# Patient Record
Sex: Female | Born: 1950 | Race: Black or African American | Hispanic: No | Marital: Single | Smoking: Never smoker
Health system: Southern US, Community
[De-identification: ages and names within clinical notes are randomized; demographics above are authoritative.]

## PROBLEM LIST (undated history)

## (undated) DIAGNOSIS — C49A2 Gastrointestinal stromal tumor of stomach: Secondary | ICD-10-CM

## (undated) DIAGNOSIS — E785 Hyperlipidemia, unspecified: Secondary | ICD-10-CM

## (undated) DIAGNOSIS — I10 Essential (primary) hypertension: Secondary | ICD-10-CM

## (undated) HISTORY — DX: Hyperlipidemia, unspecified: E78.5

## (undated) HISTORY — DX: Gastrointestinal stromal tumor of stomach: C49.A2

---

## 2012-09-28 ENCOUNTER — Emergency Department (INDEPENDENT_AMBULATORY_CARE_PROVIDER_SITE_OTHER)
Admission: EM | Admit: 2012-09-28 | Discharge: 2012-09-28 | Disposition: A | Discharge status: Home / Self Care | Payer: Self-pay | Source: Home / Self Care

## 2012-09-28 ENCOUNTER — Encounter (HOSPITAL_COMMUNITY): Payer: Self-pay | Admitting: Emergency Medicine

## 2012-09-28 DIAGNOSIS — I10 Essential (primary) hypertension: Secondary | ICD-10-CM

## 2012-09-28 HISTORY — DX: Essential (primary) hypertension: I10

## 2012-09-28 LAB — POCT I-STAT, CHEM 8
Chloride: 100 mEq/L (ref 96–112)
HCT: 43 % (ref 36.0–46.0)
Hemoglobin: 14.6 g/dL (ref 12.0–15.0)
Potassium: 2.8 mEq/L — ABNORMAL LOW (ref 3.5–5.1)

## 2012-09-28 MED ORDER — LISINOPRIL 20 MG PO TABS: 20.0000 mg | ORAL_TABLET | Freq: Every day | ORAL | Status: DC

## 2012-09-28 NOTE — ED Notes (Signed)
Patient has history of htn and has been on medicine in [place], arrived in [place] on Wednesday, reports htn medicine taken from her at airport.  Patient reports head with sharp apin, instructed daughter this is how head feels with her blood pressure going up.

## 2012-09-28 NOTE — ED Provider Notes (Signed)
Katie Howell is a 62 y.o. female who presents to Urgent Care today for hypertension. Patient recently arrived to the [place] from [place]. She has been here several days. She has run out of her blood pressure medication and cannot recall what it was. He otherwise feels well with no fevers or chills significant headache or chest pain. She does not yet have Dr. her and feels well otherwise.    PMH reviewed. HTN  History  Substance Use Topics  . Smoking status: Never Smoker   . Smokeless tobacco: Not on file  . Alcohol Use: No   ROS as above Medications reviewed. No current facility-administered medications for this encounter.   Current Outpatient Prescriptions  Medication Sig Dispense Refill  . lisinopril (PRINIVIL,ZESTRIL) 20 MG tablet Take 1 tablet (20 mg total) by mouth daily.  30 tablet  1    Exam:  BP 182/104  Pulse 68  Temp(Src) 97.8 F (36.6 C) (Oral)  Resp 16  SpO2 98%  LMP 09/28/2012 Gen: Well NAD HEENT: EOMI,  MMM Lungs: CTABL Nl WOB Heart: RRR no MRG Abd: NABS, NT, ND Exts: Non edematous BL  LE, warm and well perfused.   Results for orders placed during the hospital encounter of 09/28/12 (from the past 24 hour(s))  POCT I-STAT, CHEM 8     Status: Abnormal   Collection Time    09/28/12  4:22 PM      Result Value Range   Sodium 143  135 - 145 mEq/L   Potassium 2.8 (*) 3.5 - 5.1 mEq/L   Chloride 100  96 - 112 mEq/L   BUN 11  6 - 23 mg/dL   Creatinine, Ser 4.78  0.50 - 1.10 mg/dL   Glucose, Bld 87  70 - 99 mg/dL   Calcium, Ion 2.95  6.21 - 1.30 mmol/L   TCO2 31  0 - 100 mmol/L   Hemoglobin 14.6  12.0 - 15.0 g/dL   HCT 30.8  65.7 - 84.6 %   Comment NOTIFIED PHYSICIAN     No results found.  Assessment and Plan: 62 y.o. female with high blood pressure. Patient is not sure what medication she was on however based on her hypo-K it was likely hydrochlorothiazide. Plan to start lisinopril 20 mg and followup in 2 weeks for lab recheck and blood pressure  recheck. Discussed warning signs or symptoms. Please see discharge instructions. Patient expresses understanding.      Rodolph Bong, MD 09/28/12 [phone]

## 2012-10-28 ENCOUNTER — Emergency Department (INDEPENDENT_AMBULATORY_CARE_PROVIDER_SITE_OTHER)
Admission: EM | Admit: 2012-10-28 | Discharge: 2012-10-28 | Disposition: A | Discharge status: Home / Self Care | Payer: Self-pay | Source: Home / Self Care | Attending: Family Medicine | Admitting: Family Medicine

## 2012-10-28 ENCOUNTER — Encounter (HOSPITAL_COMMUNITY): Payer: Self-pay | Admitting: *Deleted

## 2012-10-28 DIAGNOSIS — I10 Essential (primary) hypertension: Secondary | ICD-10-CM

## 2012-10-28 NOTE — ED Notes (Signed)
Pt  Is  Out  Of  Her  Blood  Pressure  Medicine       -  She  Gets  Headaches      Her  bLooood pressure  Is  High  Today  She  Did  Not take       Her meds  Today    She  Has  No  pcp

## 2012-10-28 NOTE — ED Provider Notes (Signed)
CSN: [id number]     Arrival date & time 10/28/12  1137 History   First MD Initiated Contact with Patient 10/28/12 1310     Chief Complaint  Patient presents with  . Medication Refill   (Consider location/radiation/quality/duration/timing/severity/associated sxs/prior Treatment) HPI Comments: 62 year old female presents requesting a refill of her antihypertensive medication. She was prescribed this medication one month ago and has been taking it daily. She has a refill but she did not do this. She was having a headache prior to starting this medication but when she takes her lisinopril the headache is weight. She denies any other symptoms whatsoever.   Past Medical History  Diagnosis Date  . Hypertension    History reviewed. No pertinent past surgical history. History reviewed. No pertinent family history. History  Substance Use Topics  . Smoking status: Never Smoker   . Smokeless tobacco: Not on file  . Alcohol Use: No   OB History   Grav Para Term Preterm Abortions TAB SAB Ect Mult Living                 Review of Systems  Constitutional: Negative for fever and chills.  Eyes: Negative for visual disturbance.  Respiratory: Negative for cough and shortness of breath.   Cardiovascular: Negative for chest pain, palpitations and leg swelling.  Gastrointestinal: Negative for nausea, vomiting and abdominal pain.  Endocrine: Negative for polydipsia and polyuria.  Genitourinary: Negative for dysuria, urgency and frequency.  Musculoskeletal: Negative for myalgias and arthralgias.  Skin: Negative for rash.  Neurological: Positive for headaches (resolved with the medication, although this has returned slightly because she did not take the lisinopril today). Negative for dizziness, weakness and light-headedness.    Allergies  Review of patient's allergies indicates no known allergies.  Home Medications   Current Outpatient Rx  Name  Route  Sig  Dispense  Refill  . lisinopril  (PRINIVIL,ZESTRIL) 20 MG tablet   Oral   Take 1 tablet (20 mg total) by mouth daily.   30 tablet   1    BP 180/117  Pulse 84  Temp(Src) 98.4 F (36.9 C) (Oral)  Resp 16  SpO2 100%  LMP 09/28/2012 Physical Exam  Nursing note and vitals reviewed. Constitutional: She is oriented to person, place, and time. She appears well-developed and well-nourished. No distress.  HENT:  Head: Normocephalic and atraumatic.  Pulmonary/Chest: Effort normal. No respiratory distress.  Neurological: She is alert and oriented to person, place, and time. Coordination normal.  Skin: Skin is warm and dry. No rash noted. She is not diaphoretic.  Psychiatric: She has a normal mood and affect. Judgment normal.    ED Course  Procedures (including critical care time) Labs Review Labs Reviewed - No data to display Imaging Review No results found.  MDM   1. Hypertension    She will go get her refill on her medication. She will followup at the [facility].    Graylon Good, PA-C 10/28/12 1349

## 2012-10-28 NOTE — ED Provider Notes (Signed)
Medical screening examination/treatment/procedure(s) were performed by non-physician practitioner and as supervising physician I was immediately available for consultation/collaboration.   MORENO-COLL,Pernell Dikes; MD  Amadi Frady Moreno-Coll, MD 10/28/12 1414 

## 2012-12-02 ENCOUNTER — Ambulatory Visit: Payer: Self-pay | Attending: Cardiology | Admitting: Cardiology

## 2012-12-02 ENCOUNTER — Encounter: Payer: Self-pay | Admitting: Cardiology

## 2012-12-02 VITALS — BP 154/97 | HR 85 | Temp 98.0°F | Resp 16 | Ht 62.01 in | Wt 142.0 lb

## 2012-12-02 DIAGNOSIS — I10 Essential (primary) hypertension: Secondary | ICD-10-CM | POA: Insufficient documentation

## 2012-12-02 MED ORDER — HYDROCHLOROTHIAZIDE 12.5 MG PO TABS: 12.5000 mg | ORAL_TABLET | Freq: Every day | ORAL | Status: DC

## 2012-12-02 NOTE — Progress Notes (Signed)
Pt is here to get est and f/u on HTN Reports she was seen recently at [facility] and was referred here  BP is 154/97 today... Denies: CP, SOB, blurry vision, edema, complaints of headache at night. Reports she's been out of BP med for x7 days She is alert w/no signs of acute distress.   I agree with the above nursing assessment. She's only been in the [place] for a short period of time from [place]. She's been complaining of headaches particularly at night. No other neurological symptoms. She's been noncompliant with her medications. The lisinopril was not controlling her blood pressure according to her daughter Malachi Bonds. She was on HCTZ before which controlled it.  She does not have any other cardiovascular risk factors. She is mildly overweight.  Physical examination is remarkable for well-developed well-nourished African female. She's in no acute distress. Vital signs noted. HEENT unremarkable. Carotid upstrokes equal bilaterally without bruits. No JVD. No thyromegaly. Chest exam clear to auscultation and percussion. Heart exam reveals a normal S1-S2 without gallop or murmur. Abdominal exam not done. Extremities reveal no cyanosis clubbing or edema. Pulses are brisk. Neuro exam is grossly intact.

## 2012-12-02 NOTE — Assessment & Plan Note (Addendum)
I feel the cause of her nocturnal headache is uncontrolled hypertension. Per her request and that of her daughter, and the fact that HCTZ controlled in the past, I started her on HCTZ 12-1/2 mg a day. We have given her instructions for a potassium rich diet. Flu vaccine offered. Return to the clinic in one week for a metabolic profile and blood pressure checked by the nurses. She will establish with the internal medicine clinic in 3 months. She will most likely need a potassium supplement but will not start her on this right away.

## 2012-12-02 NOTE — Patient Instructions (Signed)
Follow in 1 week for BP check and BMET (labs)

## 2012-12-09 ENCOUNTER — Ambulatory Visit: Payer: Self-pay | Attending: Internal Medicine

## 2012-12-09 VITALS — BP 173/116 | HR 74 | Temp 97.8°F | Resp 16 | Ht 62.0 in | Wt 144.0 lb

## 2012-12-09 DIAGNOSIS — I10 Essential (primary) hypertension: Secondary | ICD-10-CM | POA: Insufficient documentation

## 2012-12-09 LAB — BASIC METABOLIC PANEL
BUN: 13 mg/dL (ref 6–23)
Chloride: 97 mEq/L (ref 96–112)
Creat: 0.74 mg/dL (ref 0.50–1.10)
Potassium: 3.3 mEq/L — ABNORMAL LOW (ref 3.5–5.3)

## 2012-12-09 MED ORDER — CLONIDINE HCL 0.1 MG PO TABS
0.2000 mg | ORAL_TABLET | Freq: Once | ORAL | Status: AC
  Administered 2012-12-09: 0.2 mg via ORAL

## 2012-12-09 NOTE — Patient Instructions (Signed)
Pt's BP was high 173/116. Gave the pt 0.2 mg of Clonidine. Rechecked BP and it had dropped to 163/112. I instructed the pt to check BP tomorrow. If BP is any higher then she need to go to the urgent care.

## 2012-12-09 NOTE — Progress Notes (Unsigned)
Pt is here today for labs only. 

## 2012-12-11 ENCOUNTER — Ambulatory Visit: Payer: No Typology Code available for payment source | Attending: Internal Medicine | Admitting: Internal Medicine

## 2012-12-11 ENCOUNTER — Ambulatory Visit: Payer: No Typology Code available for payment source | Attending: Internal Medicine

## 2012-12-11 ENCOUNTER — Encounter: Payer: Self-pay | Admitting: Internal Medicine

## 2012-12-11 VITALS — BP 163/106 | HR 96 | Temp 98.7°F | Resp 16 | Ht 62.0 in | Wt 145.0 lb

## 2012-12-11 DIAGNOSIS — I10 Essential (primary) hypertension: Secondary | ICD-10-CM

## 2012-12-11 DIAGNOSIS — G4700 Insomnia, unspecified: Secondary | ICD-10-CM

## 2012-12-11 DIAGNOSIS — E876 Hypokalemia: Secondary | ICD-10-CM

## 2012-12-11 DIAGNOSIS — Z23 Encounter for immunization: Secondary | ICD-10-CM

## 2012-12-11 DIAGNOSIS — Z0000 Encounter for general adult medical examination without abnormal findings: Secondary | ICD-10-CM

## 2012-12-11 MED ORDER — LISINOPRIL-HYDROCHLOROTHIAZIDE 20-25 MG PO TABS: 1.0000 | ORAL_TABLET | Freq: Every day | ORAL | Status: DC

## 2012-12-11 MED ORDER — POTASSIUM CHLORIDE CRYS ER 20 MEQ PO TBCR: 20.0000 meq | EXTENDED_RELEASE_TABLET | Freq: Every day | ORAL | Status: DC

## 2012-12-11 NOTE — Progress Notes (Signed)
Patient ID: Katie Howell, female   DOB: Mar 06, 1950, 62 y.o.   MRN: [id number]   HPI: This is a 62 year old female who moved here recently from [place] in the spring. She has a history of hypertension. She was taking 2 antihypertensives in [place] which was keeping her pressure under control but since being here her pressure has not been under control. She was placed on HCTZ a week ago by Dr. Daleen Squibb (cardiology) but BP remains uncontrolled today. The daughter has readings of blood pressure that they have taken as an outpatient which are also noted to be elevated.  According to an urgent care note done on September 3, the patient was taking lisinopril at home at that time. The patient also complains of insomnia and has not tried any over-the-counter medication for this.  No Known Allergies Past Medical History  Diagnosis Date  . Hypertension    Prior to Admission medications   Medication Sig Start Date End Date Taking? Authorizing Provider  hydrochlorothiazide (HYDRODIURIL) 12.5 MG tablet Take 1 tablet (12.5 mg total) by mouth daily. 12/02/12  Yes Gaylord Shih, MD  lisinopril-hydrochlorothiazide (PRINZIDE,ZESTORETIC) 20-25 MG per tablet Take 1 tablet by mouth daily. 12/11/12   Calvert Cantor, MD  potassium chloride SA (K-DUR,KLOR-CON) 20 MEQ tablet Take 1 tablet (20 mEq total) by mouth daily. 12/11/12   Calvert Cantor, MD   History reviewed. No pertinent family history. History   Social History  . Marital Status: Single    Spouse Name: N/A    Number of Children: N/A  . Years of Education: N/A   Occupational History  . Not on file.   Social History Main Topics  . Smoking status: Never Smoker   . Smokeless tobacco: Not on file  . Alcohol Use: No  . Drug Use: No  . Sexual Activity: Not on file   Other Topics Concern  . Not on file   Social History Narrative  . No narrative on file      Objective:   Filed Vitals:   12/11/12 1442  BP: 163/106  Pulse: 96  Temp: 98.7 F (37.1 C)   Resp: 16   Filed Weights   12/11/12 1442  Weight: 145 lb (65.772 kg)     Physical Exam ______ Constitutional: Appears well-developed and well-nourished. No distress. ____ HENT: Normocephalic. External right and left ear normal. Oropharynx is clear and moist. ____ Eyes: Conjunctivae and EOM are normal. PERRLA, no scleral icterus. ____ Neck: Normal ROM. Neck supple. No JVD. No tracheal deviation. No thyromegaly. ____ CVS: RRR, S1/S2 +, no murmurs, no gallops, no carotid bruit.  Pulmonary: Effort and breath sounds normal, no stridor, rhonchi, wheezes, rales.  Abdominal: Soft. BS +,  no distension, tenderness, rebound or guarding. ________ Musculoskeletal: Normal range of motion. No edema and no tenderness. ____ Neuro: Alert. Normal reflexes, muscle tone coordination. No cranial nerve deficit. Skin: Skin is warm and dry. No rash noted. Not diaphoretic. No erythema. No pallor. ____ Psychiatric: Normal mood and affect. Behavior, judgment, thought content normal. __  Lab Results  Component Value Date   HGB 14.6 09/28/2012   HCT 43.0 09/28/2012   Lab Results  Component Value Date   CREATININE 0.74 12/09/2012   BUN 13 12/09/2012   NA 135 12/09/2012   K 3.3* 12/09/2012   CL 97 12/09/2012   CO2 31 12/09/2012    No results found for this basename: HGBA1C   Lipid Panel  No results found for this basename: chol,  trig,  hdl,  cholhdl,  vldl,  ldlcalc       Assessment and plan:   Patient Active Problem List   Diagnosis Date Noted  . Insomnia 12/11/2012  . Hypokalemia 12/11/2012  . Essential hypertension 12/02/2012   Preventative care: Mammogram: Ordered Colonoscopy : Consult request Pap Smear : Needs to be done on another visit Flu vaccine: Given Tdap: Not avail currently  LABS: Metabolic panel: Ordered to be checked next week CBC: Ordered Vitamin D : Order Lipid Panel: Ordered TSH: Ordered  Assessment and plan: Hypertension: -She clearly needs a combination of 2  medications at this point. -Will switch to lisinopril/HCTZ 20/25 mg daily. -Daughter will continue to check her mother's blood pressure as an outpatient at the [facility]  Hypokalemia -We are starting an ACE inhibitor which should hopefully bring the potassium up - will also add a low dose of 20 mEq of potassium daily -Repeat metabolic panel in one week-and cold potassium should be greater than 3.5  Insomnia -Over-the-counter Benadryl 25-50 mg or melatonin 5-10 mg at bedtime  Return to clinic in one month.    Calvert Cantor, MD

## 2012-12-11 NOTE — Patient Instructions (Signed)
  Try over the counter Benadryl (25 to 50 mg) or Melatonin (5-10 mg) at bedtime for insomnia.

## 2012-12-11 NOTE — Progress Notes (Signed)
Pt is here to check on her HTN. Today her BP is 163/106. She has been charting her BP for the past week and all the readings have been very high. Pt reports that she has not slept in 3 days and she's been having frequent headaches.

## 2012-12-16 ENCOUNTER — Telehealth: Payer: Self-pay | Admitting: Emergency Medicine

## 2012-12-16 NOTE — Telephone Encounter (Signed)
Left message for pt to call clinic for results 

## 2012-12-17 ENCOUNTER — Telehealth: Payer: Self-pay | Admitting: Emergency Medicine

## 2012-12-17 NOTE — Telephone Encounter (Signed)
2nd message left for pt to follow potassium diet and continue taking supp

## 2012-12-17 NOTE — Telephone Encounter (Signed)
Message copied by Darlis Loan on Thu Dec 17, 2012  3:04 PM ------      Message from: Valera [place] C      Created: Wed Dec 16, 2012  9:21 AM       Please make sure she starts her potassium supplement and follows potassium rich diet. ------

## 2012-12-18 ENCOUNTER — Ambulatory Visit: Payer: No Typology Code available for payment source | Attending: Internal Medicine

## 2012-12-18 DIAGNOSIS — Z0000 Encounter for general adult medical examination without abnormal findings: Secondary | ICD-10-CM

## 2012-12-18 DIAGNOSIS — G4700 Insomnia, unspecified: Secondary | ICD-10-CM

## 2012-12-18 DIAGNOSIS — I10 Essential (primary) hypertension: Secondary | ICD-10-CM | POA: Insufficient documentation

## 2012-12-18 LAB — LIPID PANEL
Cholesterol: 240 mg/dL — ABNORMAL HIGH (ref 0–200)
HDL: 44 mg/dL (ref >39)
LDL Cholesterol: 151 mg/dL — ABNORMAL HIGH (ref 0–99)
Total CHOL/HDL Ratio: 5.5 Ratio
Triglycerides: 225 mg/dL — ABNORMAL HIGH (ref <150)
VLDL: 45 mg/dL — ABNORMAL HIGH (ref 0–40)

## 2012-12-18 LAB — BASIC METABOLIC PANEL
Chloride: 97 mEq/L (ref 96–112)
Potassium: 3.4 mEq/L — ABNORMAL LOW (ref 3.5–5.3)
Sodium: 134 mEq/L — ABNORMAL LOW (ref 135–145)

## 2012-12-19 LAB — CBC WITH DIFFERENTIAL/PLATELET
Basophils Absolute: 0 10*3/uL (ref 0.0–0.1)
Basophils Relative: 1 % (ref 0–1)
Eosinophils Absolute: 0.1 10*3/uL (ref 0.0–0.7)
Eosinophils Relative: 3 % (ref 0–5)
HCT: 37.8 % (ref 36.0–46.0)
Hemoglobin: 12.8 g/dL (ref 12.0–15.0)
Lymphocytes Relative: 60 % — ABNORMAL HIGH (ref 12–46)
Lymphs Abs: 2.8 10*3/uL (ref 0.7–4.0)
MCH: 23.3 pg — ABNORMAL LOW (ref 26.0–34.0)
MCHC: 33.9 g/dL (ref 30.0–36.0)
MCV: 68.7 fL — ABNORMAL LOW (ref 78.0–100.0)
Monocytes Absolute: 0.3 10*3/uL (ref 0.1–1.0)
Monocytes Relative: 7 % (ref 3–12)
Neutro Abs: 1.4 10*3/uL — ABNORMAL LOW (ref 1.7–7.7)
Neutrophils Relative %: 29 % — ABNORMAL LOW (ref 43–77)
Platelets: 257 10*3/uL (ref 150–400)
RBC: 5.5 MIL/uL — ABNORMAL HIGH (ref 3.87–5.11)
RDW: 16.1 % — ABNORMAL HIGH (ref 11.5–15.5)
WBC: 4.8 10*3/uL (ref 4.0–10.5)

## 2012-12-19 LAB — VITAMIN D 25 HYDROXY (VIT D DEFICIENCY, FRACTURES): Vit D, 25-Hydroxy: 29 ng/mL — ABNORMAL LOW (ref 30–89)

## 2012-12-19 LAB — TSH: TSH: 1.249 u[IU]/mL (ref 0.350–4.500)

## 2013-01-18 ENCOUNTER — Encounter: Payer: Self-pay | Admitting: Internal Medicine

## 2013-01-18 ENCOUNTER — Ambulatory Visit: Payer: No Typology Code available for payment source | Attending: Internal Medicine | Admitting: Internal Medicine

## 2013-01-18 VITALS — BP 115/78 | HR 89 | Temp 98.1°F | Resp 16 | Ht 61.0 in | Wt 148.0 lb

## 2013-01-18 DIAGNOSIS — E559 Vitamin D deficiency, unspecified: Secondary | ICD-10-CM | POA: Insufficient documentation

## 2013-01-18 DIAGNOSIS — I10 Essential (primary) hypertension: Secondary | ICD-10-CM

## 2013-01-18 DIAGNOSIS — E785 Hyperlipidemia, unspecified: Secondary | ICD-10-CM | POA: Insufficient documentation

## 2013-01-18 MED ORDER — SIMVASTATIN 20 MG PO TABS: 20.0000 mg | ORAL_TABLET | Freq: Every day | ORAL | Status: DC

## 2013-01-18 MED ORDER — VITAMIN D (ERGOCALCIFEROL) 1.25 MG (50000 UNIT) PO CAPS: 50000.0000 [IU] | ORAL_CAPSULE | ORAL | Status: DC

## 2013-01-18 NOTE — Progress Notes (Signed)
Pt here f/u HTN with medication Taking prescribed bp meds daily C/o slight left side h/a intermit BP- 115/78 Daughter translator

## 2013-01-18 NOTE — Progress Notes (Signed)
Patient ID: Katie Howell, female   DOB: 21-Apr-1950, 62 y.o.   MRN: [id number]  CC:  HPI:  62 year old female with a history of hypertension, who presents for a followup. The patient's antihypertensive regimen was recently changed because of hypokalemia. The patient was started on lisinopril. Blood pressure stable today. No complaints  All labs reviewed patient was found to have low vitamin D, elevated triglycerides and LDL  No Known Allergies Past Medical History  Diagnosis Date  . Hypertension    Current Outpatient Prescriptions on File Prior to Visit  Medication Sig Dispense Refill  . lisinopril-hydrochlorothiazide (PRINZIDE,ZESTORETIC) 20-25 MG per tablet Take 1 tablet by mouth daily.  90 tablet  3  . potassium chloride SA (K-DUR,KLOR-CON) 20 MEQ tablet Take 1 tablet (20 mEq total) by mouth daily.  30 tablet  3   No current facility-administered medications on file prior to visit.   History reviewed. No pertinent family history. History   Social History  . Marital Status: Single    Spouse Name: N/A    Number of Children: N/A  . Years of Education: N/A   Occupational History  . Not on file.   Social History Main Topics  . Smoking status: Never Smoker   . Smokeless tobacco: Not on file  . Alcohol Use: No  . Drug Use: No  . Sexual Activity: Not on file   Other Topics Concern  . Not on file   Social History Narrative  . No narrative on file    Review of Systems  Constitutional: Negative for fever, chills, diaphoresis, activity change, appetite change and fatigue.  HENT: Negative for ear pain, nosebleeds, congestion, facial swelling, rhinorrhea, neck pain, neck stiffness and ear discharge.   Eyes: Negative for pain, discharge, redness, itching and visual disturbance.  Respiratory: Negative for cough, choking, chest tightness, shortness of breath, wheezing and stridor.   Cardiovascular: Negative for chest pain, palpitations and leg swelling.  Gastrointestinal: Negative  for abdominal distention.  Genitourinary: Negative for dysuria, urgency, frequency, hematuria, flank pain, decreased urine volume, difficulty urinating and dyspareunia.  Musculoskeletal: Negative for back pain, joint swelling, arthralgias and gait problem.  Neurological: Negative for dizziness, tremors, seizures, syncope, facial asymmetry, speech difficulty, weakness, light-headedness, numbness and headaches.  Hematological: Negative for adenopathy. Does not bruise/bleed easily.  Psychiatric/Behavioral: Negative for hallucinations, behavioral problems, confusion, dysphoric mood, decreased concentration and agitation.    Objective:   Filed Vitals:   01/18/13 1606  BP: 115/78  Pulse: 89  Temp: 98.1 F (36.7 C)  Resp: 16    Physical Exam  Constitutional: Appears well-developed and well-nourished. No distress.  HENT: Normocephalic. External right and left ear normal. Oropharynx is clear and moist.  Eyes: Conjunctivae and EOM are normal. PERRLA, no scleral icterus.  Neck: Normal ROM. Neck supple. No JVD. No tracheal deviation. No thyromegaly.  CVS: RRR, S1/S2 +, no murmurs, no gallops, no carotid bruit.  Pulmonary: Effort and breath sounds normal, no stridor, rhonchi, wheezes, rales.  Abdominal: Soft. BS +,  no distension, tenderness, rebound or guarding.  Musculoskeletal: Normal range of motion. No edema and no tenderness.  Lymphadenopathy: No lymphadenopathy noted, cervical, inguinal. Neuro: Alert. Normal reflexes, muscle tone coordination. No cranial nerve deficit. Skin: Skin is warm and dry. No rash noted. Not diaphoretic. No erythema. No pallor.  Psychiatric: Normal mood and affect. Behavior, judgment, thought content normal.   Lab Results  Component Value Date   WBC 4.8 12/18/2012   HGB 12.8 12/18/2012   HCT 37.8 12/18/2012  MCV 68.7* 12/18/2012   PLT 257 12/18/2012   Lab Results  Component Value Date   CREATININE 0.84 12/18/2012   BUN 14 12/18/2012   NA 134* 12/18/2012    K 3.4* 12/18/2012   CL 97 12/18/2012   CO2 30 12/18/2012    No results found for this basename: HGBA1C   Lipid Panel     Component Value Date/Time   CHOL 240* 12/18/2012 1501   TRIG 225* 12/18/2012 1501   HDL 44 12/18/2012 1501   CHOLHDL 5.5 12/18/2012 1501   VLDL 45* 12/18/2012 1501   LDLCALC 151* 12/18/2012 1501       Assessment and plan:   Patient Active Problem List   Diagnosis Date Noted  . Insomnia 12/11/2012  . Hypokalemia 12/11/2012  . Essential hypertension 12/02/2012   Hypertension Patient to continue with lisinopril/HCTZ   Dyslipidemia with triglyceridemia and elevated LDL Start the patient on Zocor Repeat lipid panel in 3 months  Vitamin D deficiency Start the patient on vitamin D replacement Recheck levels in 3 months  Followup in 3 months      The patient was given clear instructions to go to ER or return to medical center if symptoms don't improve, worsen or new problems develop. The patient verbalized understanding. The patient was told to call to get any lab results if not heard anything in the next week.

## 2013-01-19 LAB — COMPLETE METABOLIC PANEL WITH GFR
ALT: 13 U/L (ref 0–35)
AST: 26 U/L (ref 0–37)
Alkaline Phosphatase: 105 U/L (ref 39–117)
BUN: 18 mg/dL (ref 6–23)
Calcium: 10.1 mg/dL (ref 8.4–10.5)
Creat: 0.94 mg/dL (ref 0.50–1.10)
Total Bilirubin: 0.6 mg/dL (ref 0.3–1.2)

## 2013-02-03 ENCOUNTER — Encounter: Payer: Self-pay | Admitting: Cardiology

## 2013-02-03 ENCOUNTER — Ambulatory Visit: Payer: No Typology Code available for payment source | Attending: Internal Medicine

## 2013-02-03 ENCOUNTER — Other Ambulatory Visit: Payer: Self-pay | Admitting: Internal Medicine

## 2013-02-03 DIAGNOSIS — E559 Vitamin D deficiency, unspecified: Secondary | ICD-10-CM

## 2013-02-03 DIAGNOSIS — E569 Vitamin deficiency, unspecified: Secondary | ICD-10-CM

## 2013-02-04 MED ORDER — VITAMIN D (ERGOCALCIFEROL) 1.25 MG (50000 UNIT) PO CAPS: 50000.0000 [IU] | ORAL_CAPSULE | ORAL | Status: DC

## 2013-02-06 LAB — VITAMIN D PNL(25-HYDRXY+1,25-DIHY)-BLD
Vitamin D 1, 25 (OH)2 Total: 54 pg/mL (ref 18–72)
Vitamin D2 1, 25 (OH)2: 40 pg/mL
Vitamin D3 1, 25 (OH)2: 14 pg/mL

## 2013-02-09 ENCOUNTER — Telehealth: Payer: Self-pay | Admitting: *Deleted

## 2013-02-09 NOTE — Telephone Encounter (Signed)
Contacted pt to inform her that her vitamin D levels are normal. Significant improvement from the previous test. Left pt a voicemail with this information.

## 2013-03-04 ENCOUNTER — Ambulatory Visit: Payer: No Typology Code available for payment source | Attending: Internal Medicine | Admitting: Family Medicine

## 2013-03-04 VITALS — BP 126/86 | HR 76 | Temp 97.8°F | Resp 15

## 2013-03-04 DIAGNOSIS — IMO0001 Reserved for inherently not codable concepts without codable children: Secondary | ICD-10-CM

## 2013-03-04 DIAGNOSIS — R030 Elevated blood-pressure reading, without diagnosis of hypertension: Secondary | ICD-10-CM

## 2013-03-04 NOTE — Progress Notes (Signed)
   Subjective:    Patient ID: Katie Howell, female    DOB: [birth year], 63 y.o.   MRN: [id number]  HPI This patient is here for blood pressure check. Usually her blood pressures run 1 teens to 120s over 70s and occasionally 80s. She has a blood pressure monitor at home and for the last 2 days her blood pressures have been higher. They have been 150s over 110s and 1 teens. The patient has had an occasional dull headache in the back of her head over the last couple days but she denies any other symptoms. They have had their blood pressure cuff at home of years and never calibrated it. It is an arm cuff not at rest.   Review of Systems A 12 point review of systems is negative except as per hpi.       Objective:   Physical Exam  Nursing note and vitals reviewed. Constitutional: She is oriented to person, place, and time. She appears well-developed and well-nourished.  HENT:  Mouth/Throat: Oropharynx is clear and moist. No oropharyngeal exudate.  Eyes: Conjunctivae are normal. Pupils are equal, round, and reactive to light.  Neck: Normal range of motion. Neck supple. No thyromegaly present.  Cardiovascular: Normal rate, regular rhythm and normal heart sounds.   Pulmonary/Chest: Effort normal and breath sounds normal.  Abdominal: Soft. Bowel sounds are normal. She exhibits no distension. There is no tenderness. There is no rebound.  Lymphadenopathy:    She has no cervical adenopathy.  Neurological: She is alert and oriented to person, place, and time. She has normal reflexes.  Skin: Skin is warm and dry.  Psychiatric: She has a normal mood and affect. Her behavior is normal.        Assessment & Plan:  The patient looks great today. Her blood pressure is within normal limits. I've asked her to keep track of her blood pressures for the last next couple of weeks and to return with the law for followup. At the same time I have asked him to bring her blood pressure cuff in so we can check its  accuracy. If she develops any new symptoms she will call and initiate develops any red flag symptoms (we discussed) she'll proceed to the emergency room.

## 2013-03-04 NOTE — Progress Notes (Signed)
Patient states her blood pressure has been running high Today in office blood pressure is 126/ 86

## 2013-03-04 NOTE — Patient Instructions (Signed)

## 2013-03-26 ENCOUNTER — Ambulatory Visit: Payer: No Typology Code available for payment source | Attending: Internal Medicine

## 2013-03-26 NOTE — Patient Instructions (Signed)
Pt instructed to go to pharmacy store to have machine re calibrated. Pt informed to return to scheduled appt

## 2013-03-26 NOTE — Progress Notes (Unsigned)
   Subjective:    Patient ID: Katie Howell, female    DOB: 09-14-1950, 63 y.o.   MRN: [id number]  HPI    Review of Systems     Objective:   Physical Exam        Assessment & Plan:  Pt here for blood pressure recheck Bp at home 129/101 Pt daughter request [place] to re calibrate machine. BP 136/92 Pt taking prescribed medications daily

## 2013-04-12 ENCOUNTER — Ambulatory Visit: Payer: Self-pay | Admitting: Internal Medicine

## 2013-05-05 ENCOUNTER — Ambulatory Visit: Payer: No Typology Code available for payment source | Attending: Internal Medicine | Admitting: Internal Medicine

## 2013-05-05 VITALS — BP 123/87 | HR 76 | Temp 98.7°F | Resp 16 | Ht 61.0 in | Wt 153.0 lb

## 2013-05-05 DIAGNOSIS — R042 Hemoptysis: Secondary | ICD-10-CM

## 2013-05-05 DIAGNOSIS — I10 Essential (primary) hypertension: Secondary | ICD-10-CM | POA: Insufficient documentation

## 2013-05-05 DIAGNOSIS — E569 Vitamin deficiency, unspecified: Secondary | ICD-10-CM

## 2013-05-05 LAB — CBC WITH DIFFERENTIAL/PLATELET
BASOS PCT: 0 % (ref 0–1)
Basophils Absolute: 0 10*3/uL (ref 0.0–0.1)
Eosinophils Absolute: 0.2 10*3/uL (ref 0.0–0.7)
Eosinophils Relative: 3 % (ref 0–5)
HEMATOCRIT: 37.5 % (ref 36.0–46.0)
HEMOGLOBIN: 12.4 g/dL (ref 12.0–15.0)
LYMPHS ABS: 2.8 10*3/uL (ref 0.7–4.0)
Lymphocytes Relative: 54 % — ABNORMAL HIGH (ref 12–46)
MCH: 23.4 pg — AB (ref 26.0–34.0)
MCHC: 33.1 g/dL (ref 30.0–36.0)
MCV: 70.9 fL — ABNORMAL LOW (ref 78.0–100.0)
MONO ABS: 0.5 10*3/uL (ref 0.1–1.0)
MONOS PCT: 10 % (ref 3–12)
NEUTROS ABS: 1.7 10*3/uL (ref 1.7–7.7)
Neutrophils Relative %: 33 % — ABNORMAL LOW (ref 43–77)
Platelets: 251 10*3/uL (ref 150–400)
RBC: 5.29 MIL/uL — AB (ref 3.87–5.11)
RDW: 16.1 % — ABNORMAL HIGH (ref 11.5–15.5)
WBC: 5.1 10*3/uL (ref 4.0–10.5)

## 2013-05-05 MED ORDER — POTASSIUM CHLORIDE CRYS ER 20 MEQ PO TBCR: 20.0000 meq | EXTENDED_RELEASE_TABLET | Freq: Every day | ORAL | Status: DC

## 2013-05-05 MED ORDER — SIMVASTATIN 20 MG PO TABS: 20.0000 mg | ORAL_TABLET | Freq: Every day | ORAL | Status: DC

## 2013-05-05 MED ORDER — VITAMIN D (ERGOCALCIFEROL) 1.25 MG (50000 UNIT) PO CAPS: 50000.0000 [IU] | ORAL_CAPSULE | ORAL | Status: AC

## 2013-05-05 MED ORDER — LISINOPRIL-HYDROCHLOROTHIAZIDE 20-25 MG PO TABS: 1.0000 | ORAL_TABLET | Freq: Every day | ORAL | Status: DC

## 2013-05-05 NOTE — Patient Instructions (Signed)
Hemoptysis  Hemoptysis, which means coughing up blood, can be a sign of a minor problem or a serious medical condition. The blood that is coughed up may come from the lungs and airways. Coughed-up blood can also come from bleeding that occurs outside the lungs and airways. Blood can drain into the windpipe during a severe nosebleed or when blood is vomited from the stomach. Because hemoptysis can be a sign of something serious, a medical evaluation is required. For some people with hemoptysis, no definite cause is ever identified.  CAUSES   The most common cause of hemoptysis is bronchitis. Some other common causes include:   · A ruptured blood vessel caused by coughing or an infection.    · A medical condition that causes damage to the large air passageways (bronchiectasis).    · A blood clot in the lungs (pulmonary embolism).    · Pneumonia.    · Tuberculosis.    · Breathing in a small foreign object.    · Cancer.  For some people with hemoptysis, no definite cause is ever identified.    HOME CARE INSTRUCTIONS  · Only take over-the-counter or prescription medicines as directed by your caregiver. Do not use cough suppressants unless your caregiver approves.  · If your caregiver prescribes antibiotic medicines, take them as directed. Finish them even if you start to feel better.  · Do not smoke. Also avoid secondhand smoke.  · Follow up with your caregiver as directed.  SEEK IMMEDIATE MEDICAL CARE IF:   · You cough up bloody mucus for longer than a week.  · You have a blood-producing cough that is severe or getting worse.  · You have a blood-producing cough that comes and goes over time.  · You develop problems with your breathing.    · You vomit blood.  · You develop bloody or black-colored stools.  · You have chest pain.    · You develop night sweats.  · You feel faint or pass out.    · You have a fever or persistent symptoms for more than 2 3 days.    · You have a fever and your symptoms suddenly get worse.  MAKE  SURE YOU:  · Understand these instructions.  · Will watch your condition.  · Will get help right away if you are not doing well or get worse.  Document Released: 04/22/2001 Document Revised: 01/29/2012 Document Reviewed: 11/29/2011  [employer]® Patient Information ©2014 [employer].

## 2013-05-05 NOTE — Progress Notes (Signed)
Pt is here following up on her HTN. Pt reports having blood in her spit and she feels like there is a knot in her throat.

## 2013-05-06 NOTE — Progress Notes (Signed)
Patient ID: Katie Howell, female   DOB: 1950/12/24, 63 y.o.   MRN: [id number]   CC: Blood in the sputum  HPI: Patient is 63 year old female who presents to clinic with main concern of spitting blood. She explains that started several weeks ago, no specific alleviating or aggravating factors, she denies pain with swallowing, no difficulty swallowing. She denies similar episodes in the past, no bloody vomiting. Patient denies having problem with acid reflux, has not taken any new medicine recently, no sick contacts or exposures. Patient also denies fevers chills, no changes in weight, no chest pain, no shortness of breath. She has no difficulty ED, episodes of spitting blood are very random and with no specific triggering factors.  No Known Allergies Past Medical History  Diagnosis Date  . Hypertension    No current outpatient prescriptions on file prior to visit.   No current facility-administered medications on file prior to visit.   No known family medical history History   Social History  . Marital Status: Single    Spouse Name: N/A    Number of Children: N/A  . Years of Education: N/A   Occupational History  . Not on file.   Social History Main Topics  . Smoking status: Never Smoker   . Smokeless tobacco: Not on file  . Alcohol Use: No  . Drug Use: No  . Sexual Activity: Not on file   Other Topics Concern  . Not on file   Social History Narrative  . No narrative on file    Review of Systems  Constitutional: Negative for fever, chills, diaphoresis, activity change, appetite change and fatigue.  HENT: Negative for ear pain, nosebleeds, congestion, facial swelling, rhinorrhea, neck pain, neck stiffness and ear discharge.   Eyes: Negative for pain, discharge, redness, itching and visual disturbance.  Respiratory: Negative for cough, choking, chest tightness, shortness of breath, wheezing and stridor.   Cardiovascular: Negative for chest pain, palpitations and leg swelling.   Gastrointestinal: Negative for abdominal distention.  Genitourinary: Negative for dysuria, urgency, frequency, hematuria, flank pain, decreased urine volume, difficulty urinating and dyspareunia.  Musculoskeletal: Negative for back pain, joint swelling, arthralgias and gait problem.  Neurological: Negative for dizziness, tremors, seizures, syncope, facial asymmetry, speech difficulty, weakness, light-headedness, numbness and headaches.  Hematological: Negative for adenopathy. Does not bruise/bleed easily.  Psychiatric/Behavioral: Negative for hallucinations, behavioral problems, confusion, dysphoric mood, decreased concentration and agitation.    Objective:   Filed Vitals:   05/05/13 1208  BP: 123/87  Pulse: 76  Temp: 98.7 F (37.1 C)  Resp: 16    Physical Exam  Constitutional: Appears well-developed and well-nourished. No distress.  HENT: Normocephalic. External right and left ear normal. Oropharynx is clear and moist.  Eyes: Conjunctivae and EOM are normal. PERRLA, no scleral icterus.  Neck: Normal ROM. Neck supple. No JVD. No tracheal deviation. No thyromegaly.  CVS: RRR, S1/S2 +, no murmurs, no gallops, no carotid bruit.  Pulmonary: Effort and breath sounds normal, no stridor, rhonchi, wheezes, rales.  Abdominal: Soft. BS +,  no distension, tenderness, rebound or guarding.  Musculoskeletal: Normal range of motion. No edema and no tenderness.  Lymphadenopathy: No lymphadenopathy noted, cervical, inguinal. Neuro: Alert. Normal reflexes, muscle tone coordination. No cranial nerve deficit. Skin: Skin is warm and dry. No rash noted. Not diaphoretic. No erythema. No pallor.  Psychiatric: Normal mood and affect. Behavior, judgment, thought content normal.   Lab Results  Component Value Date   WBC 5.1 05/05/2013   HGB 12.4 05/05/2013  HCT 37.5 05/05/2013   MCV 70.9* 05/05/2013   PLT 251 05/05/2013   Lab Results  Component Value Date   CREATININE 0.94 01/18/2013   BUN 18  01/18/2013   NA 138 01/18/2013   K 3.0* 01/18/2013   CL 96 01/18/2013   CO2 33* 01/18/2013    No results found for this basename: HGBA1C   Lipid Panel     Component Value Date/Time   CHOL 240* 12/18/2012 1501   TRIG 225* 12/18/2012 1501   HDL 44 12/18/2012 1501   CHOLHDL 5.5 12/18/2012 1501   VLDL 45* 12/18/2012 1501   LDLCALC 151* 12/18/2012 1501       Assessment and plan:   Patient Active Problem List   Diagnosis Date Noted  .  blood in sputum  - unclear etiology, examination of the throat reveals no acute findings, CBC indicates normal hemoglobin within normal limits. I will provide referral to ENT for further evaluation  12/11/2012  . Essential hypertension - stable and well controlled. Patient advised to check blood pressure regularly and to call his back if the numbers are higher than 140/90.  12/02/2012

## 2013-06-28 ENCOUNTER — Telehealth: Payer: Self-pay | Admitting: Internal Medicine

## 2013-06-28 NOTE — Telephone Encounter (Signed)
Pt's daughter in-law called and states that her mother in law is still in a lot of pain, Please contact pt for further infomation

## 2013-07-06 NOTE — Telephone Encounter (Signed)
Pt's daughter calling because she has not received call back.  Pt is seeing gum coming out of mouth but does not have pain, not sure where blood is coming from.

## 2013-07-07 ENCOUNTER — Encounter: Payer: Self-pay | Admitting: Internal Medicine

## 2013-07-07 ENCOUNTER — Ambulatory Visit: Payer: No Typology Code available for payment source | Attending: Internal Medicine | Admitting: Internal Medicine

## 2013-07-07 VITALS — BP 126/81 | HR 87 | Temp 97.9°F | Resp 20 | Ht 62.0 in | Wt 153.4 lb

## 2013-07-07 DIAGNOSIS — E785 Hyperlipidemia, unspecified: Secondary | ICD-10-CM

## 2013-07-07 DIAGNOSIS — I10 Essential (primary) hypertension: Secondary | ICD-10-CM

## 2013-07-07 DIAGNOSIS — R042 Hemoptysis: Secondary | ICD-10-CM

## 2013-07-07 MED ORDER — LISINOPRIL-HYDROCHLOROTHIAZIDE 20-25 MG PO TABS: 1.0000 | ORAL_TABLET | Freq: Every day | ORAL | Status: DC

## 2013-07-07 MED ORDER — SIMVASTATIN 20 MG PO TABS: 20.0000 mg | ORAL_TABLET | Freq: Every day | ORAL | Status: DC

## 2013-07-07 NOTE — Progress Notes (Signed)
Patient is here to F/U for bleeding in mouth. States this only occurs while she is sleepin. States 2 month history of non-productive cough.

## 2013-07-07 NOTE — Progress Notes (Signed)
Patient ID: Katie Howell, female   DOB: 1950/04/12, 63 y.o.   MRN: [id number]  CC: blood in sputum  HPI: Patient reports today with c/o blood in her mouth at night.  Patient states that she often awakes at night and notices blood in her sputum.  Patient reports that when she spits she finds streaks of blood.  She denies cough, nose bleeds, chest pain, or mucous production.  Patient reports that this has been going on for nightly for a total of two months.    No Known Allergies Past Medical History  Diagnosis Date  . Hypertension    Current Outpatient Prescriptions on File Prior to Visit  Medication Sig Dispense Refill  . Vitamin D, Ergocalciferol, (DRISDOL) 50000 UNITS CAPS capsule Take 1 capsule (50,000 Units total) by mouth every 7 (seven) days.  30 capsule  7  . potassium chloride SA (K-DUR,KLOR-CON) 20 MEQ tablet Take 1 tablet (20 mEq total) by mouth daily.  30 tablet  7   No current facility-administered medications on file prior to visit.   No family history on file. History   Social History  . Marital Status: Single    Spouse Name: N/A    Number of Children: N/A  . Years of Education: N/A   Occupational History  . Not on file.   Social History Main Topics  . Smoking status: Never Smoker   . Smokeless tobacco: Not on file  . Alcohol Use: No  . Drug Use: No  . Sexual Activity: Not on file   Other Topics Concern  . Not on file   Social History Narrative  . No narrative on file    Review of Systems  Constitutional: Negative for fever, chills and weight loss.  HENT: Negative for congestion, ear discharge, ear pain, nosebleeds and sore throat.   Respiratory: Positive for hemoptysis and sputum production. Negative for cough and shortness of breath.   Cardiovascular: Negative for chest pain and palpitations.  Gastrointestinal: Negative.   Genitourinary: Negative.   Neurological: Positive for headaches.  Endo/Heme/Allergies: Negative.      Objective:   Filed  Vitals:   07/07/13 1509  BP: 126/81  Pulse: 87  Temp: 97.9 F (36.6 C)  Resp: 20    Physical Exam  Vitals reviewed. Constitutional: She is oriented to person, place, and time. She appears well-developed.  HENT:  Right Ear: External ear normal.  Left Ear: External ear normal.  Mouth/Throat: Oropharynx is clear and moist.  Eyes: Pupils are equal, round, and reactive to light.  Neck: Normal range of motion. Neck supple.  Cardiovascular: Normal rate, regular rhythm and normal heart sounds.   Pulmonary/Chest: Effort normal and breath sounds normal.  Abdominal: Soft.  Lymphadenopathy:    She has no cervical adenopathy.  Neurological: She is alert and oriented to person, place, and time.  Psychiatric: She has a normal mood and affect.     Lab Results  Component Value Date   WBC 5.1 05/05/2013   HGB 12.4 05/05/2013   HCT 37.5 05/05/2013   MCV 70.9* 05/05/2013   PLT 251 05/05/2013   Lab Results  Component Value Date   CREATININE 0.94 01/18/2013   BUN 18 01/18/2013   NA 138 01/18/2013   K 3.0* 01/18/2013   CL 96 01/18/2013   CO2 33* 01/18/2013    No results found for this basename: HGBA1C   Lipid Panel     Component Value Date/Time   CHOL 240* 12/18/2012 1501   TRIG 225* 12/18/2012 1501  HDL 44 12/18/2012 1501   CHOLHDL 5.5 12/18/2012 1501   VLDL 45* 12/18/2012 1501   LDLCALC 151* 12/18/2012 1501       Assessment and plan:   Katie Howell was seen today for follow-up and cough.  Diagnoses and associated orders for this visit:  Hemoptysis - Ambulatory referral to ENT No clear etiology found that would be responsible for hemoptysis.  Patient was referred to ENT 2 months prior was never called for an appointment.   HTN (hypertension) - lisinopril-hydrochlorothiazide (PRINZIDE,ZESTORETIC) 20-25 MG per tablet; Take 1 tablet by mouth daily. refilled  HLD (hyperlipidemia) - simvastatin (ZOCOR) 20 MG tablet; Take 1 tablet (20 mg total) by mouth at bedtime.  refilled - Potassium  Will refill potassium pills if needed once potassium level is back.     Chari Manning, NP-C [facility] [phone] 07/07/2013, 6:32 PM

## 2013-07-08 LAB — POTASSIUM: Potassium: 3.2 mEq/L — ABNORMAL LOW (ref 3.5–5.3)

## 2013-07-09 ENCOUNTER — Telehealth: Payer: Self-pay

## 2013-07-09 NOTE — Telephone Encounter (Signed)
Patient called and notified of her lab results. Patient indicates she has Potassium supplement. She was informed she should continue taking Potassium, and patient verbalized understanding.

## 2013-10-04 ENCOUNTER — Encounter: Payer: Self-pay | Admitting: Internal Medicine

## 2013-10-04 ENCOUNTER — Ambulatory Visit: Payer: Self-pay | Attending: Internal Medicine | Admitting: Internal Medicine

## 2013-10-04 VITALS — BP 139/88 | HR 77 | Temp 97.9°F | Resp 20 | Ht 62.0 in | Wt 157.0 lb

## 2013-10-04 DIAGNOSIS — R042 Hemoptysis: Secondary | ICD-10-CM | POA: Insufficient documentation

## 2013-10-04 DIAGNOSIS — E785 Hyperlipidemia, unspecified: Secondary | ICD-10-CM | POA: Insufficient documentation

## 2013-10-04 DIAGNOSIS — R109 Unspecified abdominal pain: Secondary | ICD-10-CM | POA: Insufficient documentation

## 2013-10-04 DIAGNOSIS — I10 Essential (primary) hypertension: Secondary | ICD-10-CM | POA: Insufficient documentation

## 2013-10-04 LAB — POCT URINALYSIS DIPSTICK
BILIRUBIN UA: NEGATIVE
Blood, UA: NEGATIVE
GLUCOSE UA: NEGATIVE
KETONES UA: NEGATIVE
LEUKOCYTES UA: NEGATIVE
Nitrite, UA: NEGATIVE
PH UA: 7
Protein, UA: NEGATIVE
Spec Grav, UA: 1.02
Urobilinogen, UA: 0.2

## 2013-10-04 NOTE — Progress Notes (Signed)
Patient presents for f/u on HTN Daughter states she's been having headaches. Patient thinks her BP is high but they have not checked it Daughter states patient has been coughing up blood for 3 months. Had appt at ENT but missed it. Also c/o back pain for 7 days. Patient states she is taking BP med only

## 2013-10-04 NOTE — Progress Notes (Signed)
Patient ID: Katie Howell, female   DOB: Mar 25, 1950, 63 y.o.   MRN: [id number]  CC: HTN, back pain  HPI:  Patient c/o of headaches for the past couple of weeks and believes that her BP is high. She states that she takes her BP medication every night.  She reports that she is continuing to cough up blood daily but she missed her ENT appointment and was not allowed to reschedule.  She c/o of back pain for one week, she states that she feels like she is carryling around something heavy. She denies fever, chills, hematuria, and dysuria.  No Known Allergies Past Medical History  Diagnosis Date  . Hypertension   . Hyperlipidemia    Current Outpatient Prescriptions on File Prior to Visit  Medication Sig Dispense Refill  . lisinopril-hydrochlorothiazide (PRINZIDE,ZESTORETIC) 20-25 MG per tablet Take 1 tablet by mouth daily.  90 tablet  3  . potassium chloride SA (K-DUR,KLOR-CON) 20 MEQ tablet Take 1 tablet (20 mEq total) by mouth daily.  30 tablet  7  . simvastatin (ZOCOR) 20 MG tablet Take 1 tablet (20 mg total) by mouth at bedtime.  90 tablet  3  . Vitamin D, Ergocalciferol, (DRISDOL) 50000 UNITS CAPS capsule Take 1 capsule (50,000 Units total) by mouth every 7 (seven) days.  30 capsule  7   No current facility-administered medications on file prior to visit.   History reviewed. No pertinent family history. History   Social History  . Marital Status: Single    Spouse Name: N/A    Number of Children: N/A  . Years of Education: N/A   Occupational History  . Not on file.   Social History Main Topics  . Smoking status: Never Smoker   . Smokeless tobacco: Not on file  . Alcohol Use: No  . Drug Use: No  . Sexual Activity: Not on file   Other Topics Concern  . Not on file   Social History Narrative  . No narrative on file    Review of Systems: See HPI    Objective:   Filed Vitals:   10/04/13 1141  BP: 139/88  Pulse: 77  Temp: 97.9 F (36.6 C)  Resp: 20   Physical Exam   Constitutional: She is oriented to person, place, and time. She appears distressed.  HENT:  Right Ear: External ear normal.  Left Ear: External ear normal.  Mouth/Throat: Oropharynx is clear and moist.  Cardiovascular: Normal rate, regular rhythm and normal heart sounds.   Pulmonary/Chest: Effort normal and breath sounds normal.  Abdominal: Soft. Bowel sounds are normal. She exhibits no distension. There is no tenderness.  Negative for CVA tenderness  Musculoskeletal: Normal range of motion. She exhibits no edema and no tenderness.  Neurological: She is alert and oriented to person, place, and time.  Skin: Skin is warm and dry.     Lab Results  Component Value Date   WBC 5.1 05/05/2013   HGB 12.4 05/05/2013   HCT 37.5 05/05/2013   MCV 70.9* 05/05/2013   PLT 251 05/05/2013   Lab Results  Component Value Date   CREATININE 0.94 01/18/2013   BUN 18 01/18/2013   NA 138 01/18/2013   K 3.2* 07/07/2013   CL 96 01/18/2013   CO2 33* 01/18/2013    No results found for this basename: HGBA1C   Lipid Panel     Component Value Date/Time   CHOL 240* 12/18/2012 1501   TRIG 225* 12/18/2012 1501   HDL 44 12/18/2012 1501  CHOLHDL 5.5 12/18/2012 1501   VLDL 45* 12/18/2012 1501   LDLCALC 151* 12/18/2012 1501       Assessment and plan:   Katie Howell was seen today for follow-up and hypertension.  Diagnoses and associated orders for this visit:  Flank pain - POCT urinalysis dipstick Patient may take tylenol for pain management. Patient should continue to monitor symptoms and RTC if no improvement.  Hypertension Patient will need RN visit in two weeks to check BP.  Patient should begin taking BP meds in the AM.   Return in about 1 week (around 10/11/2013) for Nurse Visit- BP Check.       Chari Manning, NP-C [facility] [phone] 10/04/2013, 12:06 PM

## 2013-12-10 ENCOUNTER — Ambulatory Visit: Payer: Self-pay | Attending: Internal Medicine | Admitting: Internal Medicine

## 2013-12-10 ENCOUNTER — Encounter: Payer: Self-pay | Admitting: Internal Medicine

## 2013-12-10 VITALS — BP 143/99 | HR 85 | Temp 98.0°F | Resp 16 | Ht 62.0 in | Wt 160.0 lb

## 2013-12-10 DIAGNOSIS — R041 Hemorrhage from throat: Secondary | ICD-10-CM | POA: Insufficient documentation

## 2013-12-10 DIAGNOSIS — Z23 Encounter for immunization: Secondary | ICD-10-CM | POA: Insufficient documentation

## 2013-12-10 DIAGNOSIS — E785 Hyperlipidemia, unspecified: Secondary | ICD-10-CM | POA: Insufficient documentation

## 2013-12-10 DIAGNOSIS — I10 Essential (primary) hypertension: Secondary | ICD-10-CM | POA: Insufficient documentation

## 2013-12-10 MED ORDER — LISINOPRIL-HYDROCHLOROTHIAZIDE 20-25 MG PO TABS: 1.0000 | ORAL_TABLET | Freq: Every day | ORAL | Status: DC

## 2013-12-10 MED ORDER — CLONIDINE HCL 0.1 MG PO TABS
0.2000 mg | ORAL_TABLET | Freq: Once | ORAL | Status: AC
  Administered 2013-12-10: 0.2 mg via ORAL

## 2013-12-10 MED ORDER — SIMVASTATIN 20 MG PO TABS: 20.0000 mg | ORAL_TABLET | Freq: Every day | ORAL | Status: DC

## 2013-12-10 NOTE — Progress Notes (Signed)
Patient ID: Katie Howell, female   DOB: 1950-04-01, 63 y.o.   MRN: [id number]  CC:  HTN, bloody throat  HPI:  Patient presents to clinic today for a follow up of hypertension and bloody mucous in her throat.  She states that she takes her BP medication daily without skipping doses.  She takes her medication every morning at 8 am.  She states that she is continuing to have the problem with bloody mucous in her throat every night.  She states that she called the ENT doctor for a appointment and they were told that our office needs to make her a appointment before she can be seen again.  She reports discomfort in the left side of her throat that feels like it is draining from her nostrils.    No Known Allergies Past Medical History  Diagnosis Date  . Hypertension   . Hyperlipidemia    Current Outpatient Prescriptions on File Prior to Visit  Medication Sig Dispense Refill  . lisinopril-hydrochlorothiazide (PRINZIDE,ZESTORETIC) 20-25 MG per tablet Take 1 tablet by mouth daily.  90 tablet  3  . potassium chloride SA (K-DUR,KLOR-CON) 20 MEQ tablet Take 1 tablet (20 mEq total) by mouth daily.  30 tablet  7  . simvastatin (ZOCOR) 20 MG tablet Take 1 tablet (20 mg total) by mouth at bedtime.  90 tablet  3  . Vitamin D, Ergocalciferol, (DRISDOL) 50000 UNITS CAPS capsule Take 1 capsule (50,000 Units total) by mouth every 7 (seven) days.  30 capsule  7   No current facility-administered medications on file prior to visit.   History reviewed. No pertinent family history. History   Social History  . Marital Status: Single    Spouse Name: N/A    Number of Children: N/A  . Years of Education: N/A   Occupational History  . Not on file.   Social History Main Topics  . Smoking status: Never Smoker   . Smokeless tobacco: Not on file  . Alcohol Use: No  . Drug Use: No  . Sexual Activity: Not on file   Other Topics Concern  . Not on file   Social History Narrative  . No narrative on file     Review of Systems: See history of present illness   Objective:   Filed Vitals:   12/10/13 1550  BP: 180/118  Pulse: 85  Temp: 98 F (36.7 C)  Resp: 16    Physical Exam  HENT:  Right Ear: External ear normal.  Left Ear: External ear normal.  Mouth/Throat: Oropharynx is clear and moist.  Cardiovascular: Normal rate, regular rhythm and normal heart sounds.   Pulmonary/Chest: Effort normal and breath sounds normal.  Skin: Skin is warm and dry.     Lab Results  Component Value Date   WBC 5.1 05/05/2013   HGB 12.4 05/05/2013   HCT 37.5 05/05/2013   MCV 70.9* 05/05/2013   PLT 251 05/05/2013   Lab Results  Component Value Date   CREATININE 0.94 01/18/2013   BUN 18 01/18/2013   NA 138 01/18/2013   K 3.2* 07/07/2013   CL 96 01/18/2013   CO2 33* 01/18/2013    No results found for this basename: HGBA1C   Lipid Panel     Component Value Date/Time   CHOL 240* 12/18/2012 1501   TRIG 225* 12/18/2012 1501   HDL 44 12/18/2012 1501   CHOLHDL 5.5 12/18/2012 1501   VLDL 45* 12/18/2012 1501   LDLCALC 151* 12/18/2012 1501  Assessment and plan:   Katie Howell was seen today for follow-up.  Diagnoses and associated orders for this visit:  Essential hypertension - cloNIDine (CATAPRES) tablet 0.2 mg; Take 2 tablets (0.2 mg total) by mouth once. - lisinopril-hydrochlorothiazide (PRINZIDE,ZESTORETIC) 20-25 MG per tablet; Take 1 tablet by mouth daily. - COMPLETE METABOLIC PANEL WITH GFR  HLD (hyperlipidemia) - simvastatin (ZOCOR) 20 MG tablet; Take 1 tablet (20 mg total) by mouth at bedtime.  Blood in throat Appointment scheduled in office for ENT  Need for influenza vaccination Received   Return in about 3 months (around 03/12/2014) for Hypertension and 2 weeks BP check.       Katie Manning, NP-C [facility] [phone] 12/21/2013, 1:07 PM

## 2013-12-10 NOTE — Progress Notes (Signed)
Pt is here c/o headaches and mucus coming from her nose and in her throat that is bloody sometimes.

## 2013-12-13 ENCOUNTER — Telehealth: Payer: Self-pay

## 2013-12-13 ENCOUNTER — Ambulatory Visit: Payer: No Typology Code available for payment source | Attending: Internal Medicine

## 2013-12-13 NOTE — Telephone Encounter (Signed)
Lab called to say they never received an SST tube for this patient Lab only received a lavender top Unable to run CMP

## 2013-12-14 ENCOUNTER — Other Ambulatory Visit: Payer: Self-pay

## 2013-12-14 ENCOUNTER — Other Ambulatory Visit: Payer: Self-pay | Admitting: Internal Medicine

## 2013-12-14 DIAGNOSIS — Z0000 Encounter for general adult medical examination without abnormal findings: Secondary | ICD-10-CM

## 2013-12-14 LAB — COMPLETE METABOLIC PANEL WITH GFR

## 2013-12-15 ENCOUNTER — Other Ambulatory Visit: Payer: Self-pay | Admitting: Otolaryngology

## 2013-12-15 ENCOUNTER — Ambulatory Visit
Admission: RE | Admit: 2013-12-15 | Discharge: 2013-12-15 | Disposition: A | Discharge status: Home / Self Care | Payer: No Typology Code available for payment source | Source: Ambulatory Visit | Attending: Otolaryngology | Admitting: Otolaryngology

## 2013-12-15 DIAGNOSIS — R042 Hemoptysis: Secondary | ICD-10-CM

## 2013-12-15 LAB — COMPLETE METABOLIC PANEL WITH GFR
ALT: 10 U/L (ref 0–35)
AST: 21 U/L (ref 0–37)
Albumin: 4.3 g/dL (ref 3.5–5.2)
Alkaline Phosphatase: 94 U/L (ref 39–117)
BUN: 10 mg/dL (ref 6–23)
CO2: 30 meq/L (ref 19–32)
CREATININE: 0.78 mg/dL (ref 0.50–1.10)
Calcium: 9.9 mg/dL (ref 8.4–10.5)
Chloride: 89 mEq/L — ABNORMAL LOW (ref 96–112)
GFR, EST NON AFRICAN AMERICAN: 80 mL/min
GFR, Est African American: >89 mL/min
GLUCOSE: 122 mg/dL — AB (ref 70–99)
Potassium: 3.4 mEq/L — ABNORMAL LOW (ref 3.5–5.3)
Sodium: 127 mEq/L — ABNORMAL LOW (ref 135–145)
TOTAL PROTEIN: 8.4 g/dL — AB (ref 6.0–8.3)
Total Bilirubin: 0.7 mg/dL (ref 0.2–1.2)

## 2013-12-17 ENCOUNTER — Telehealth: Payer: Self-pay | Admitting: Internal Medicine

## 2013-12-17 ENCOUNTER — Other Ambulatory Visit: Payer: Self-pay | Admitting: *Deleted

## 2013-12-17 MED ORDER — LEVOFLOXACIN 750 MG PO TABS
500.0000 mg | ORAL_TABLET | Freq: Every day | ORAL | Status: AC
Start: 2013-12-17 — End: ?

## 2013-12-17 NOTE — Telephone Encounter (Signed)
Pts daughter is calling to inquire about her mothers results. Please follow up with pt.

## 2013-12-17 NOTE — Progress Notes (Signed)
After reviewing Xray results I called pt and informed her that the xray showed she had pneumonia. I told her that she had to take a medication called levaquin. After she completes the medication we would order another imaging of her chest.

## 2013-12-20 ENCOUNTER — Telehealth: Payer: Self-pay | Admitting: Emergency Medicine

## 2013-12-20 ENCOUNTER — Telehealth: Payer: Self-pay | Admitting: Internal Medicine

## 2013-12-20 MED ORDER — POTASSIUM CHLORIDE CRYS ER 10 MEQ PO TBCR: 10.0000 meq | EXTENDED_RELEASE_TABLET | Freq: Every day | ORAL | Status: AC

## 2013-12-20 NOTE — Telephone Encounter (Signed)
Pt has scheduled nurse visit with BP check on 12/27/13

## 2013-12-20 NOTE — Telephone Encounter (Signed)
Pt's daughter called stating that the pt's blood pressure has been going up the past couple of days pt's daughter states that her blood pressure last night was 160/118.Marland Kitchen Please f/u with pt's son.

## 2013-12-20 NOTE — Telephone Encounter (Signed)
Spoke to pt son to interpret lab results with medication instructions. Pt to take Potassium /chloride 10 meq daily x 30 day A1c added to prior scheduled nurse visit for 12/28/13 Medication e-scribed to [place]

## 2013-12-28 ENCOUNTER — Ambulatory Visit: Payer: Self-pay | Attending: Internal Medicine | Admitting: *Deleted

## 2013-12-28 VITALS — BP 109/76 | HR 81 | Temp 98.1°F | Resp 22

## 2013-12-28 DIAGNOSIS — R739 Hyperglycemia, unspecified: Secondary | ICD-10-CM | POA: Insufficient documentation

## 2013-12-28 LAB — POCT GLYCOSYLATED HEMOGLOBIN (HGB A1C): Hemoglobin A1C: 6.4

## 2013-12-28 NOTE — Progress Notes (Signed)
Patient presents with daughter for BP check and HgbA1c  Daughter states that every 3-4 months patient's BP is elevated for a couple of days  and then returns to normal States patient denies increased stress at these times and  states taking prinizide as directed without missed doses Noted that patient cares for grandchildren at home  BP 109/76 P 81 R 22 T 98.1 oral SPO2 99%  HgbA1c 6.4  Patient and daughter educated on prediabetes and diet and exercise changes needed to prolong onset of diabetes Information provided on Diabetes and Food and Diabetes and Exercise  Per PCP: no new meds or changes to meds at this time   Instructed to call for refills on meds at least 1 week prior to running out so as not to miss doses.  Patient and daughter reminded to make f/u appt with PCP for mid January

## 2013-12-28 NOTE — Patient Instructions (Signed)
Diabetes Mellitus and Food It is important for you to manage your blood sugar (glucose) level. Your blood glucose level can be greatly affected by what you eat. Eating healthier foods in the appropriate amounts throughout the day at about the same time each day will help you control your blood glucose level. It can also help slow or prevent worsening of your diabetes mellitus. Healthy eating may even help you improve the level of your blood pressure and reach or maintain a healthy weight.  HOW CAN FOOD AFFECT ME? Carbohydrates Carbohydrates affect your blood glucose level more than any other type of food. Your dietitian will help you determine how many carbohydrates to eat at each meal and teach you how to count carbohydrates. Counting carbohydrates is important to keep your blood glucose at a healthy level, especially if you are using insulin or taking certain medicines for diabetes mellitus. Alcohol Alcohol can cause sudden decreases in blood glucose (hypoglycemia), especially if you use insulin or take certain medicines for diabetes mellitus. Hypoglycemia can be a life-threatening condition. Symptoms of hypoglycemia (sleepiness, dizziness, and disorientation) are similar to symptoms of having too much alcohol.  If your health care provider has given you approval to drink alcohol, do so in moderation and use the following guidelines:  Women should not have more than one drink per day, and men should not have more than two drinks per day. One drink is equal to:  12 oz of beer.  5 oz of wine.  1 oz of hard liquor.  Do not drink on an empty stomach.  Keep yourself hydrated. Have water, diet soda, or unsweetened iced tea.  Regular soda, juice, and other mixers might contain a lot of carbohydrates and should be counted. WHAT FOODS ARE NOT RECOMMENDED? As you make food choices, it is important to remember that all foods are not the same. Some foods have fewer nutrients per serving than other  foods, even though they might have the same number of calories or carbohydrates. It is difficult to get your body what it needs when you eat foods with fewer nutrients. Examples of foods that you should avoid that are high in calories and carbohydrates but low in nutrients include:  Trans fats (most processed foods list trans fats on the Nutrition Facts label).  Regular soda.  Juice.  Candy.  Sweets, such as cake, pie, doughnuts, and cookies.  Fried foods. WHAT FOODS CAN I EAT? Have nutrient-rich foods, which will nourish your body and keep you healthy. The food you should eat also will depend on several factors, including:  The calories you need.  The medicines you take.  Your weight.  Your blood glucose level.  Your blood pressure level.  Your cholesterol level. You also should eat a variety of foods, including:  Protein, such as meat, poultry, fish, tofu, nuts, and seeds (lean animal proteins are best).  Fruits.  Vegetables.  Dairy products, such as milk, cheese, and yogurt (low fat is best).  Breads, grains, pasta, cereal, rice, and beans.  Fats such as olive oil, trans fat-free margarine, canola oil, avocado, and olives. DOES EVERYONE WITH DIABETES MELLITUS HAVE THE SAME MEAL PLAN? Because every person with diabetes mellitus is different, there is not one meal plan that works for everyone. It is very important that you meet with a dietitian who will help you create a meal plan that is just right for you. Document Released: 11/08/2004 Document Revised: 02/16/2013 Document Reviewed: 01/08/2013 Exit[employer] Patient Information 2015 [employer]. This   information is not intended to replace advice given to you by your health care provider. Make sure you discuss any questions you have with your health care provider. Diabetes and Exercise Exercising regularly is important. It is not just about losing weight. It has many health benefits, such as:  Improving your overall  fitness, flexibility, and endurance.  Increasing your bone density.  Helping with weight control.  Decreasing your body fat.  Increasing your muscle strength.  Reducing stress and tension.  Improving your overall health. People with diabetes who exercise gain additional benefits because exercise:  Reduces appetite.  Improves the body's use of blood sugar (glucose).  Helps lower or control blood glucose.  Decreases blood pressure.  Helps control blood lipids (such as cholesterol and triglycerides).  Improves the body's use of the hormone insulin by:  Increasing the body's insulin sensitivity.  Reducing the body's insulin needs.  Decreases the risk for heart disease because exercising:  Lowers cholesterol and triglycerides levels.  Increases the levels of good cholesterol (such as high-density lipoproteins [HDL]) in the body.  Lowers blood glucose levels. YOUR ACTIVITY PLAN  Choose an activity that you enjoy and set realistic goals. Your health care provider or diabetes educator can help you make an activity plan that works for you. Exercise regularly as directed by your health care provider. This includes:  Performing resistance training twice a week such as push-ups, sit-ups, lifting weights, or using resistance bands.  Performing 150 minutes of cardio exercises each week such as walking, running, or playing sports.  Staying active and spending no more than 90 minutes at one time being inactive. Even short bursts of exercise are good for you. Three 10-minute sessions spread throughout the day are just as beneficial as a single 30-minute session. Some exercise ideas include:  Taking the dog for a walk.  Taking the stairs instead of the elevator.  Dancing to your favorite song.  Doing an exercise video.  Doing your favorite exercise with a friend. RECOMMENDATIONS FOR EXERCISING WITH TYPE 1 OR TYPE 2 DIABETES   Check your blood glucose before exercising. If  blood glucose levels are greater than 240 mg/dL, check for urine ketones. Do not exercise if ketones are present.  Avoid injecting insulin into areas of the body that are going to be exercised. For example, avoid injecting insulin into:  The arms when playing tennis.  The legs when jogging.  Keep a record of:  Food intake before and after you exercise.  Expected peak times of insulin action.  Blood glucose levels before and after you exercise.  The type and amount of exercise you have done.  Review your records with your health care provider. Your health care provider will help you to develop guidelines for adjusting food intake and insulin amounts before and after exercising.  If you take insulin or oral hypoglycemic agents, watch for signs and symptoms of hypoglycemia. They include:  Dizziness.  Shaking.  Sweating.  Chills.  Confusion.  Drink plenty of water while you exercise to prevent dehydration or heat stroke. Body water is lost during exercise and must be replaced.  Talk to your health care provider before starting an exercise program to make sure it is safe for you. Remember, almost any type of activity is better than none. Document Released: 05/04/2003 Document Revised: 06/28/2013 Document Reviewed: 07/21/2012 ExitCare Patient Information 2015 [employer]. This information is not intended to replace advice given to you by your health care provider. Make sure you discuss any   questions you have with your health care provider.  

## 2014-01-06 ENCOUNTER — Ambulatory Visit: Payer: Self-pay | Admitting: Internal Medicine

## 2014-03-18 ENCOUNTER — Ambulatory Visit: Payer: No Typology Code available for payment source | Admitting: Internal Medicine

## 2014-03-25 ENCOUNTER — Ambulatory Visit: Payer: Self-pay | Attending: Internal Medicine | Admitting: Internal Medicine

## 2014-03-25 ENCOUNTER — Encounter: Payer: Self-pay | Admitting: Internal Medicine

## 2014-03-25 VITALS — BP 189/114 | HR 82 | Temp 98.3°F | Resp 20 | Ht 63.0 in | Wt 154.0 lb

## 2014-03-25 DIAGNOSIS — E785 Hyperlipidemia, unspecified: Secondary | ICD-10-CM

## 2014-03-25 DIAGNOSIS — Z79899 Other long term (current) drug therapy: Secondary | ICD-10-CM | POA: Insufficient documentation

## 2014-03-25 DIAGNOSIS — I10 Essential (primary) hypertension: Secondary | ICD-10-CM

## 2014-03-25 MED ORDER — CLONIDINE HCL 0.1 MG PO TABS
0.1000 mg | ORAL_TABLET | Freq: Once | ORAL | Status: AC
  Administered 2014-03-25: 0.1 mg via ORAL

## 2014-03-25 MED ORDER — LISINOPRIL-HYDROCHLOROTHIAZIDE 20-25 MG PO TABS: 1.0000 | ORAL_TABLET | Freq: Every day | ORAL | Status: DC

## 2014-03-25 MED ORDER — SIMVASTATIN 20 MG PO TABS: 20.0000 mg | ORAL_TABLET | Freq: Every day | ORAL | Status: DC

## 2014-03-25 NOTE — Patient Instructions (Addendum)
Please call me in two weeks with your BP reading so that I can make sure you are stable. I will make a decision at that time if I want to add on more medication or not. Thanks   Hypertension Hypertension, commonly called high blood pressure, is when the force of blood pumping through your arteries is too strong. Your arteries are the blood vessels that carry blood from your heart throughout your body. A blood pressure reading consists of a higher number over a lower number, such as 110/72. The higher number (systolic) is the pressure inside your arteries when your heart pumps. The lower number (diastolic) is the pressure inside your arteries when your heart relaxes. Ideally you want your blood pressure below 120/80. Hypertension forces your heart to work harder to pump blood. Your arteries may become narrow or stiff. Having hypertension puts you at risk for heart disease, stroke, and other problems.  RISK FACTORS Some risk factors for high blood pressure are controllable. Others are not.  Risk factors you cannot control include:   Race. You may be at higher risk if you are African American.  Age. Risk increases with age.  Gender. Men are at higher risk than women before age 106 years. After age 26, women are at higher risk than men. Risk factors you can control include:  Not getting enough exercise or physical activity.  Being overweight.  Getting too much fat, sugar, calories, or salt in your diet.  Drinking too much alcohol. SIGNS AND SYMPTOMS Hypertension does not usually cause signs or symptoms. Extremely high blood pressure (hypertensive crisis) may cause headache, anxiety, shortness of breath, and nosebleed. DIAGNOSIS  To check if you have hypertension, your health care provider will measure your blood pressure while you are seated, with your arm held at the level of your heart. It should be measured at least twice using the same arm. Certain conditions can cause a difference in blood  pressure between your right and left arms. A blood pressure reading that is higher than normal on one occasion does not mean that you need treatment. If one blood pressure reading is high, ask your health care provider about having it checked again. TREATMENT  Treating high blood pressure includes making lifestyle changes and possibly taking medicine. Living a healthy lifestyle can help lower high blood pressure. You may need to change some of your habits. Lifestyle changes may include:  Following the DASH diet. This diet is high in fruits, vegetables, and whole grains. It is low in salt, red meat, and added sugars.  Getting at least 2 hours of brisk physical activity every week.  Losing weight if necessary.  Not smoking.  Limiting alcoholic beverages.  Learning ways to reduce stress. If lifestyle changes are not enough to get your blood pressure under control, your health care provider may prescribe medicine. You may need to take more than one. Work closely with your health care provider to understand the risks and benefits. HOME CARE INSTRUCTIONS  Have your blood pressure rechecked as directed by your health care provider.   Take medicines only as directed by your health care provider. Follow the directions carefully. Blood pressure medicines must be taken as prescribed. The medicine does not work as well when you skip doses. Skipping doses also puts you at risk for problems.   Do not smoke.   Monitor your blood pressure at home as directed by your health care provider. SEEK MEDICAL CARE IF:   You think you are having  a reaction to medicines taken.  You have recurrent headaches or feel dizzy.  You have swelling in your ankles.  You have trouble with your vision. SEEK IMMEDIATE MEDICAL CARE IF:  You develop a severe headache or confusion.  You have unusual weakness, numbness, or feel faint.  You have severe chest or abdominal pain.  You vomit repeatedly.  You have  trouble breathing. MAKE SURE YOU:   Understand these instructions.  Will watch your condition.  Will get help right away if you are not doing well or get worse. Document Released: 02/11/2005 Document Revised: 06/28/2013 Document Reviewed: 12/04/2012 [facility] Patient Information 2015 [place], [place]. This information is not intended to replace advice given to you by your health care provider. Make sure you discuss any questions you have with your health care provider.

## 2014-03-25 NOTE — Progress Notes (Signed)
Patient here for hypertension follow-up Patient taking blood pressure at home if patient feels "high" or if she has a headache BP elevated 166/117 in left arm and 167/108 in right arm 0.1 Clonidine given per protocol  1230-BP rechecked and 189/114. Chari Manning, NP alerted. Patient agitated but asymptomatic.

## 2014-03-25 NOTE — Progress Notes (Signed)
Patient ID: Katie Howell, female   DOB: [birth year], 64 y.o.   MRN: [id number] Subjective:  Katie Howell is a 64 y.o. female with hypertension. Current Outpatient Prescriptions  Medication Sig Dispense Refill  . lisinopril-hydrochlorothiazide (PRINZIDE,ZESTORETIC) 20-25 MG per tablet Take 1 tablet by mouth daily. 90 tablet 3  . potassium chloride SA (K-DUR,KLOR-CON) 10 MEQ tablet Take 1 tablet (10 mEq total) by mouth daily. 30 tablet 0  . simvastatin (ZOCOR) 20 MG tablet Take 1 tablet (20 mg total) by mouth at bedtime. 90 tablet 3  . levofloxacin (LEVAQUIN) 750 MG tablet Take 0.5 tablets (375 mg total) by mouth daily. (Patient not taking: Reported on 03/25/2014) 7 tablet 0  . Vitamin D, Ergocalciferol, (DRISDOL) 50000 UNITS CAPS capsule Take 1 capsule (50,000 Units total) by mouth every 7 (seven) days. (Patient not taking: Reported on 03/25/2014) 30 capsule 7   No current facility-administered medications for this visit.    Hypertension ROS: taking medications as instructed, no medication side effects noted, no TIA's, no chest pain on exertion, no dyspnea on exertion and no swelling of ankles.  New concerns: Patient reports that she is leaving to go to [place] soon to help take care of her daughter and may not be able to come back for appointments and medication refills.   Objective:  BP 167/108 mmHg  Pulse 84  Temp(Src) 98.3 F (36.8 C) (Oral)  Resp 20  Ht 5\' 3"  (1.6 m)  Wt 154 lb (69.854 kg)  BMI 27.29 kg/m2  SpO2 100%  LMP 09/28/2012  Appearance alert, well appearing, and in no distress and oriented to person, place, and time. General exam BP noted to be severely elevated today in office though normal at other visits, S1, S2 normal, no gallop, no murmur, chest clear, no JVD, no HSM, no edema, CVS exam  - normal rate, regular rhythm, normal S1, S2, no murmurs, rubs, clicks or gallops, normal bilateral carotid upstroke without bruits, no JVD.  Lab review: labs are reviewed, up to date and  normal.   Assessment:   Hypertension control uncertain. Patient claims that she has been taking her medication but in the past her BP has been down to 324-401 systolic. I would like for the patient to come back in one week or at least call the office after checking her pressures for several days at a local drug store.   Plan:  Reviewed diet, exercise and weight control. Recommended sodium restriction. Copy of written low fat low cholesterol diet provided and reviewed. I will keep therapy the same and recheck BP before adding additional therapy and risk lowering her pressure too much. I highly suspect the patient has not been taking her medication as directed..  Return in about 3 months (around 06/24/2014) for Hypertension.  Chari Manning, NP 05/10/2014 2:49 PM

## 2014-07-01 ENCOUNTER — Other Ambulatory Visit: Payer: Self-pay | Admitting: Internal Medicine

## 2014-12-14 DIAGNOSIS — C49A2 Gastrointestinal stromal tumor of stomach: Secondary | ICD-10-CM | POA: Insufficient documentation

## 2014-12-14 HISTORY — DX: Gastrointestinal stromal tumor of stomach: C49.A2

## 2016-05-29 ENCOUNTER — Ambulatory Visit: Payer: No Typology Code available for payment source | Attending: Family Medicine | Admitting: Family Medicine

## 2016-05-29 ENCOUNTER — Encounter: Payer: Self-pay | Admitting: Family Medicine

## 2016-05-29 ENCOUNTER — Telehealth: Payer: Self-pay | Admitting: Gastroenterology

## 2016-05-29 VITALS — BP 166/101 | HR 81 | Temp 97.4°F | Resp 18 | Ht 62.0 in | Wt 143.4 lb

## 2016-05-29 DIAGNOSIS — Z8639 Personal history of other endocrine, nutritional and metabolic disease: Secondary | ICD-10-CM

## 2016-05-29 DIAGNOSIS — I10 Essential (primary) hypertension: Secondary | ICD-10-CM

## 2016-05-29 DIAGNOSIS — Z87898 Personal history of other specified conditions: Secondary | ICD-10-CM

## 2016-05-29 DIAGNOSIS — L97521 Non-pressure chronic ulcer of other part of left foot limited to breakdown of skin: Secondary | ICD-10-CM | POA: Insufficient documentation

## 2016-05-29 DIAGNOSIS — R7303 Prediabetes: Secondary | ICD-10-CM

## 2016-05-29 DIAGNOSIS — E785 Hyperlipidemia, unspecified: Secondary | ICD-10-CM | POA: Insufficient documentation

## 2016-05-29 DIAGNOSIS — R1032 Left lower quadrant pain: Secondary | ICD-10-CM | POA: Insufficient documentation

## 2016-05-29 DIAGNOSIS — C49A0 Gastrointestinal stromal tumor, unspecified site: Secondary | ICD-10-CM | POA: Insufficient documentation

## 2016-05-29 DIAGNOSIS — Z79899 Other long term (current) drug therapy: Secondary | ICD-10-CM | POA: Insufficient documentation

## 2016-05-29 MED ORDER — CEPHALEXIN 500 MG PO CAPS: 500.0000 mg | ORAL_CAPSULE | Freq: Two times a day (BID) | ORAL | 0 refills | Status: AC

## 2016-05-29 MED ORDER — TRUE METRIX METER W/DEVICE KIT: 1.0000 | PACK | Freq: Once | 0 refills | Status: AC

## 2016-05-29 MED ORDER — BACITRACIN 500 UNIT/GM EX OINT: 1.0000 "application " | TOPICAL_OINTMENT | Freq: Two times a day (BID) | CUTANEOUS | 0 refills | Status: AC

## 2016-05-29 MED ORDER — CLONIDINE HCL 0.2 MG PO TABS: 0.2000 mg | ORAL_TABLET | Freq: Once | ORAL | Status: AC

## 2016-05-29 MED ORDER — TRUEPLUS LANCETS 28G MISC: 1.0000 | Freq: Once | 12 refills | Status: AC

## 2016-05-29 MED ORDER — METFORMIN HCL 500 MG PO TABS: 500.0000 mg | ORAL_TABLET | Freq: Two times a day (BID) | ORAL | 0 refills | Status: DC

## 2016-05-29 MED ORDER — GLUCOSE BLOOD VI STRP: ORAL_STRIP | 12 refills | Status: AC

## 2016-05-29 MED ORDER — LISINOPRIL-HYDROCHLOROTHIAZIDE 20-25 MG PO TABS: 1.0000 | ORAL_TABLET | Freq: Every day | ORAL | 3 refills | Status: DC

## 2016-05-29 MED ORDER — LISINOPRIL-HYDROCHLOROTHIAZIDE 20-25 MG PO TABS
1.0000 | ORAL_TABLET | Freq: Every day | ORAL | 0 refills | Status: DC
Start: 2016-05-29 — End: 2016-09-03

## 2016-05-29 MED FILL — LISINOPRIL-HCTZ 20-25 MG TA: 20-25 | 90 days supply | Qty: 90 | Fill #0

## 2016-05-29 MED FILL — TRUEplus LANCETS 28G MISC: 25 days supply | Qty: 100 | Fill #0

## 2016-05-29 MED FILL — TRUE METRIX TEST STRIP: 25 days supply | Qty: 100 | Fill #0

## 2016-05-29 MED FILL — metFORMIN HCL 500 MG TABS: 500 | 30 days supply | Qty: 60 | Fill #0

## 2016-05-29 MED FILL — TRUE METRIX BLOOD GLUCOSE M: W/DEVICE | 1 days supply | Qty: 1 | Fill #0

## 2016-05-29 MED FILL — CEPHALEXIN 500 MG CAPSULE: 500 | 14 days supply | Qty: 28 | Fill #0

## 2016-05-29 NOTE — Telephone Encounter (Signed)
Spoke with patient's daughter states she will get all records from Hospital in [place] regarding Gastrointestinal stromal tumor faxed to our office to be reviewed before advising on scheduling an appointment.

## 2016-05-29 NOTE — Progress Notes (Signed)
Patient is here for establish care  Patient is here for HTN & DM  Patient complains about  Lower back pain   Patient been without medication for more than 2 weeks now  Patient has not eaten for today

## 2016-05-29 NOTE — Progress Notes (Signed)
Subjective:  Patient ID: Katie Howell, female    DOB: Jan 16, 1951  Age: 66 y.o. MRN: [id number]  CC: Establish Care   HPI Katie Howell presents for   She is accompanied by her daughter who helps interpret for patient.    HTN: Being without medication for HTN for 2 weeks. Denies any CP, SOB, or swelling of the BLE. She does not take BP at home.  History of gastric tumor: Reports LLQ abdominal pain. Her daughter brings discharge paperwork from [facility]. 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 Was given Gleevec to help manage condition. Denies following up since diagnosis. Denies any hematochezia, melena., or weight loss.  Sore left great toe: Area to left great toe discovered during diabetic foot exam. Patient reports history of a [place]. When asked when the injury occurred she is unable to give a time. She appears uneasy and not forthright with providing information about the foot wound. Reports using Vaseline to areas. Denies any pain.    Outpatient Medications Prior to Visit  Medication Sig Dispense Refill  . levofloxacin (LEVAQUIN) 750 MG tablet Take 0.5 tablets (375 mg total) by mouth daily. (Patient not taking: Reported on 03/25/2014) 7 tablet 0  . potassium chloride (K-DUR) 10 MEQ tablet TAKE ONE TABLET BY MOUTH ONCE DAILY 30 tablet 0  . potassium chloride SA (K-DUR,KLOR-CON) 10 MEQ tablet Take 1 tablet (10 mEq total) by mouth daily. 30 tablet 0  . simvastatin (ZOCOR) 20 MG tablet Take 1 tablet (20 mg total) by mouth at bedtime. For cholesterol 90 tablet 3  . Vitamin D, Ergocalciferol, (DRISDOL) 50000 UNITS CAPS capsule Take 1 capsule (50,000 Units total) by mouth every 7 (seven) days. (Patient not taking: Reported on 03/25/2014) 30 capsule 7  . lisinopril-hydrochlorothiazide (PRINZIDE,ZESTORETIC) 20-25 MG per tablet Take 1 tablet by mouth daily. For blood pressure 90 tablet 3   No facility-administered medications prior to  visit.     ROS Review of Systems  Constitutional: Negative.   HENT: Negative.   Eyes: Negative.   Respiratory: Negative.   Cardiovascular: Negative.   Gastrointestinal: Positive for abdominal pain.  Skin: Positive for wound.    Objective:  BP (!) 172/98 (BP Location: Left Arm, Patient Position: Sitting, Cuff Size: Normal)   Pulse 81   Temp 97.4 F (36.3 C) (Oral)   Resp 18   Ht _0  (1.575 m)   Wt 143 lb 6.4 oz (65 kg)   LMP 09/28/2012   SpO2 100%   BMI 26.23 kg/m   BP/Weight 05/29/2016 03/25/2014 37/09/5883  Systolic BP 027 741 287  Diastolic BP 98 867 76  Wt. (Lbs) 143.4 154 -  BMI 26.23 27.29 -    Physical Exam  HENT:  Head: Normocephalic.  Right Ear: External ear normal.  Left Ear: External ear normal.  Nose: Nose normal.  Mouth/Throat: Oropharynx is clear and moist.  Eyes: Conjunctivae are normal. Pupils are equal, round, and reactive to light.  Neck: Normal range of motion. No JVD present.  Cardiovascular: Normal rate, regular rhythm, normal heart sounds and intact distal pulses.   Pulmonary/Chest: Effort normal and breath sounds normal.  Abdominal: Soft. Bowel sounds are normal. There is tenderness (LLQ ).  Skin: Skin is warm and dry.  Maceration to left great toe, ingrown toenail present. No drainage present.   Nursing note and vitals reviewed.  Diabetic Foot Exam - Simple   Simple Foot Form Diabetic Foot exam was performed  with the following findings:  Yes 05/29/2016 11:22 AM  Visual Inspection Sensation Testing Intact to touch and monofilament testing bilaterally:  Yes Pulse Check Posterior Tibialis and Dorsalis pulse intact bilaterally:  Yes Comments Maceration to left great toe, ingrown toenail present. No drainage present.        Assessment & Plan:   Problem List Items Addressed This Visit      Cardiovascular and Mediastinum   Essential hypertension - Primary   Relevant Medications   cloNIDine (CATAPRES) tablet 0.2 mg    lisinopril-hydrochlorothiazide (PRINZIDE,ZESTORETIC) 20-25 MG tablet   Other Relevant Orders   CMP14+EGFR   Lipid Panel   Microalbumin/Creatinine Ratio, Urine    Other Visit Diagnoses    Skin ulcer of left foot, limited to breakdown of skin (HCC)       -Area cleansed with NS and pat dry . Bacitracin was applied and small dressing applied.   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   Relevant Orders   CMP14+EGFR   CBC with Differential   Lipid Panel   Ambulatory referral to Oncology   Ambulatory referral to Gastroenterology   History of tumor       History of hyperlipidemia       Relevant Orders   CMP14+EGFR   Lipid Panel   Prediabetes       Relevant Medications            metFORMIN (GLUCOPHAGE) 500 MG tablet   TRUEPLUS LANCETS 28G MISC   Blood Glucose Monitoring Suppl (TRUE METRIX METER) w/Device KIT   glucose blood test strip   Other Relevant Orders   Glucose (CBG)   HgB A1c   CMP14+EGFR   Lipid Panel   Microalbumin/Creatinine Ratio, Urine      Meds ordered this encounter  Medications  . cloNIDine (CATAPRES) tablet 0.2 mg  . DISCONTD: lisinopril-hydrochlorothiazide (PRINZIDE,ZESTORETIC) 20-25 MG tablet    Sig: Take 1 tablet by mouth daily. For blood pressure    Dispense:  90 tablet    Refill:  3    Please fill 3 month supply today    Order Specific Question:   Supervising Provider    Answer:   Quentin Angst [id number]  . lisinopril-hydrochlorothiazide (PRINZIDE,ZESTORETIC) 20-25 MG tablet    Sig: Take 1 tablet by mouth daily. For blood pressure    Dispense:  90 tablet    Refill:  0    Order Specific Question:   Supervising Provider    Answer:   Quentin Angst [id number]  . TRUEPLUS LANCETS 28G MISC    Sig: 1 kit by Does not apply route once.    Dispense:  100 each    Refill:  12    Order Specific Question:   Supervising Provider    Answer:   Quentin Angst [id number]  . Blood Glucose Monitoring Suppl (TRUE METRIX METER) w/Device KIT    Sig: 1 Device by Does not apply route once.    Dispense:  1 kit    Refill:  0    Order Specific Question:   Supervising Provider    Answer:   Quentin Angst [id number]  . glucose  blood test strip    Sig: Use as instructed    Dispense:  100 each    Refill:  12    Order Specific Question:   Supervising Provider    Answer:   Tresa Garter [[id number]]  . cephALEXin (KEFLEX) 500 MG capsule    Sig: Take 1 capsule (500 mg total) by mouth 2 (two) times daily.    Dispense:  28 capsule    Refill:  0    Order Specific Question:   Supervising Provider    Answer:   Tresa Garter [id number]  . bacitracin 500 UNIT/GM ointment    Sig: Apply 1 application topically 2 (two) times daily. To affected area, for 14 days.    Dispense:  28 g    Refill:  0    Order Specific Question:   Supervising Provider    Answer:   Tresa Garter [id number]  . metFORMIN (GLUCOPHAGE) 500 MG tablet    Sig: Take 1 tablet (500 mg total) by mouth 2 (two) times daily with a meal.    Dispense:  180 tablet    Refill:  0    Order Specific Question:   Supervising Provider    Answer:   Tresa Garter [[id number]]    Follow-up: Return in about 2 weeks (around 06/12/2016) for Diabetes and Hypertension.   Alfonse Spruce FNP

## 2016-05-29 NOTE — Telephone Encounter (Signed)
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 Unfortunately, we have absolutely no information about where GIST is, when it was found, if any treatment has been completed or if it has been worked up at all thus far. If have not indication if it is benign or malignant etc. We would not be able to give her appropriate care without first getting records of this.

## 2016-05-29 NOTE — Patient Instructions (Addendum)
Apply for orange card as soon as possible to complete referral process. Please request medical records and have sent to our office as soon as possible. Start taking blood pressure daily and keep a log to bring to next visit. Start taking blood sugars daily and keep a lot to bring to next visit.   Diabetes and Foot Care Diabetes may cause you to have problems because of poor blood supply (circulation) to your feet and legs. This may cause the skin on your feet to become thinner, break easier, and heal more slowly. Your skin may become dry, and the skin may peel and crack. You may also have nerve damage in your legs and feet causing decreased feeling in them. You may not notice minor injuries to your feet that could lead to infections or more serious problems. Taking care of your feet is one of the most important things you can do for yourself. Follow these instructions at home:  Wear shoes at all times, even in the house. Do not go barefoot. Bare feet are easily injured.  Check your feet daily for blisters, cuts, and redness. If you cannot see the bottom of your feet, use a mirror or ask someone for help.  Wash your feet with warm water (do not use hot water) and mild soap. Then pat your feet and the areas between your toes until they are completely dry. Do not soak your feet as this can dry your skin.  Apply a moisturizing lotion or petroleum jelly (that does not contain alcohol and is unscented) to the skin on your feet and to dry, brittle toenails. Do not apply lotion between your toes.  Trim your toenails straight across. Do not dig under them or around the cuticle. File the edges of your nails with an emery board or nail file.  Do not cut corns or calluses or try to remove them with medicine.  Wear clean socks or stockings every day. Make sure they are not too tight. Do not wear knee-high stockings since they may decrease blood flow to your legs.  Wear shoes that fit properly and have  enough cushioning. To break in new shoes, wear them for just a few hours a day. This prevents you from injuring your feet. Always look in your shoes before you put them on to be sure there are no objects inside.  Do not cross your legs. This may decrease the blood flow to your feet.  If you find a minor scrape, cut, or break in the skin on your feet, keep it and the skin around it clean and dry. These areas may be cleansed with mild soap and water. Do not cleanse the area with peroxide, alcohol, or iodine.  When you remove an adhesive bandage, be sure not to damage the skin around it.  If you have a wound, look at it several times a day to make sure it is healing.  Do not use heating pads or hot water bottles. They may burn your skin. If you have lost feeling in your feet or legs, you may not know it is happening until it is too late.  Make sure your health care provider performs a complete foot exam at least annually or more often if you have foot problems. Report any cuts, sores, or bruises to your health care provider immediately. Contact a health care provider if:  You have an injury that is not healing.  You have cuts or breaks in the skin.  You have  an ingrown nail.  You notice redness on your legs or feet.  You feel burning or tingling in your legs or feet.  You have pain or cramps in your legs and feet.  Your legs or feet are numb.  Your feet always feel cold. Get help right away if:  There is increasing redness, swelling, or pain in or around a wound.  There is a red line that goes up your leg.  Pus is coming from a wound.  You develop a fever or as directed by your health care provider.  You notice a bad smell coming from an ulcer or wound. This information is not intended to replace advice given to you by your health care provider. Make sure you discuss any questions you have with your health care provider. Document Released: 02/09/2000 Document Revised:  07/20/2015 Document Reviewed: 07/21/2012 Elsevier Interactive Patient Education  2017 [place], Adult Taking care of your wound properly can help to prevent pain and infection. It can also help your wound to heal more quickly. How is this treated? Wound care   Follow instructions from your health care provider about how to take care of your wound. Make sure you:  Wash your hands with soap and water before you change the bandage (dressing). If soap and water are not available, use hand sanitizer.  Change your dressing as told by your health care provider.  Leave stitches (sutures), skin glue, or adhesive strips in place. These skin closures may need to stay in place for 2 weeks or longer. If adhesive strip edges start to loosen and curl up, you may trim the loose edges. Do not remove adhesive strips completely unless your health care provider tells you to do that.  Check your wound area every day for signs of infection. Check for:  More redness, swelling, or pain.  More fluid or blood.  Warmth.  Pus or a bad smell.  Ask your health care provider if you should clean the wound with mild soap and water. Doing this may include:  Using a clean towel to pat the wound dry after cleaning it. Do not rub or scrub the wound.  Applying a cream or ointment. Do this only as told by your health care provider.  Covering the incision with a clean dressing.  Ask your health care provider when you can leave the wound uncovered. Medicines    If you were prescribed an antibiotic medicine, cream, or ointment, take or use the antibiotic as told by your health care provider. Do not stop taking or using the antibiotic even if your condition improves.  Take over-the-counter and prescription medicines only as told by your health care provider. If you were prescribed pain medicine, take it at least 30 minutes before doing any wound care or as told by your health care provider. General  instructions   Return to your normal activities as told by your health care provider. Ask your health care provider what activities are safe.  Do not scratch or pick at the wound.  Keep all follow-up visits as told by your health care provider. This is important.  Eat a diet that includes protein, vitamin A, vitamin C, and other nutrient-rich foods. These help the wound heal:  Protein-rich foods include meat, dairy, beans, nuts, and other sources.  Vitamin A-rich foods include carrots and dark green, leafy vegetables.  Vitamin C-rich foods include citrus, tomatoes, and other fruits and vegetables.  Nutrient-rich foods have protein, carbohydrates, fat,  vitamins, or minerals. Eat a variety of healthy foods including vegetables, fruits, and whole grains. Contact a health care provider if:  You received a tetanus shot and you have swelling, severe pain, redness, or bleeding at the injection site.  Your pain is not controlled with medicine.  You have more redness, swelling, or pain around the wound.  You have more fluid or blood coming from the wound.  Your wound feels warm to the touch.  You have pus or a bad smell coming from the wound.  You have a fever or chills.  You are nauseous or you vomit.  You are dizzy. Get help right away if:  You have a red streak going away from your wound.  The edges of the wound open up and separate.  Your wound is bleeding and the bleeding does not stop with gentle pressure.  You have a rash.  You faint.  You have trouble breathing. This information is not intended to replace advice given to you by your health care provider. Make sure you discuss any questions you have with your health care provider. Document Released: 11/21/2007 Document Revised: 10/11/2015 Document Reviewed: 08/29/2015 Elsevier Interactive Patient Education  2017 [employer].

## 2016-05-30 LAB — MICROALBUMIN / CREATININE URINE RATIO
Creatinine, Urine: 12.9 mg/dL
MICROALB/CREAT RATIO: 123.3 mg/g{creat} — AB (ref 0.0–30.0)
MICROALBUM., U, RANDOM: 15.9 ug/mL

## 2016-05-30 LAB — CBC WITH DIFFERENTIAL/PLATELET
Basophils Absolute: 0 10*3/uL (ref 0.0–0.2)
Basos: 1 %
EOS (ABSOLUTE): 0.1 10*3/uL (ref 0.0–0.4)
Eos: 3 %
HEMOGLOBIN: 10.2 g/dL — AB (ref 11.1–15.9)
Hematocrit: 33.2 % — ABNORMAL LOW (ref 34.0–46.6)
Immature Grans (Abs): 0 10*3/uL (ref 0.0–0.1)
Immature Granulocytes: 0 %
LYMPHS ABS: 1.2 10*3/uL (ref 0.7–3.1)
LYMPHS: 44 %
MCH: 24.2 pg — AB (ref 26.6–33.0)
MCHC: 30.7 g/dL — AB (ref 31.5–35.7)
MCV: 79 fL (ref 79–97)
MONOCYTES: 6 %
Monocytes Absolute: 0.2 10*3/uL (ref 0.1–0.9)
NEUTROS PCT: 46 %
Neutrophils Absolute: 1.2 10*3/uL — ABNORMAL LOW (ref 1.4–7.0)
Platelets: 191 10*3/uL (ref 150–379)
RBC: 4.22 x10E6/uL (ref 3.77–5.28)
RDW: 17.9 % — AB (ref 12.3–15.4)
WBC: 2.7 10*3/uL — ABNORMAL LOW (ref 3.4–10.8)

## 2016-05-30 LAB — CMP14+EGFR
A/G RATIO: 1.2 (ref 1.2–2.2)
ALT: 9 IU/L (ref 0–32)
AST: 24 IU/L (ref 0–40)
Albumin: 4.2 g/dL (ref 3.6–4.8)
Alkaline Phosphatase: 72 IU/L (ref 39–117)
BILIRUBIN TOTAL: 0.5 mg/dL (ref 0.0–1.2)
BUN/Creatinine Ratio: 13 (ref 12–28)
BUN: 12 mg/dL (ref 8–27)
CALCIUM: 9.1 mg/dL (ref 8.7–10.3)
CHLORIDE: 105 mmol/L (ref 96–106)
CO2: 25 mmol/L (ref 18–29)
Creatinine, Ser: 0.92 mg/dL (ref 0.57–1.00)
GFR calc Af Amer: 74 mL/min/{1.73_m2} (ref >59)
GFR calc non Af Amer: 64 mL/min/{1.73_m2} (ref >59)
GLUCOSE: 99 mg/dL (ref 65–99)
Globulin, Total: 3.5 g/dL (ref 1.5–4.5)
Potassium: 3.7 mmol/L (ref 3.5–5.2)
Sodium: 144 mmol/L (ref 134–144)
TOTAL PROTEIN: 7.7 g/dL (ref 6.0–8.5)

## 2016-05-30 LAB — LIPID PANEL
Chol/HDL Ratio: 3.7 ratio (ref 0.0–4.4)
Cholesterol, Total: 219 mg/dL — ABNORMAL HIGH (ref 100–199)
HDL: 59 mg/dL (ref >39)
LDL CALC: 133 mg/dL — AB (ref 0–99)
TRIGLYCERIDES: 133 mg/dL (ref 0–149)
VLDL Cholesterol Cal: 27 mg/dL (ref 5–40)

## 2016-06-03 ENCOUNTER — Ambulatory Visit: Payer: Self-pay | Attending: Family Medicine

## 2016-06-03 ENCOUNTER — Telehealth: Payer: Self-pay

## 2016-06-03 ENCOUNTER — Other Ambulatory Visit: Payer: Self-pay | Admitting: Family Medicine

## 2016-06-03 DIAGNOSIS — D509 Iron deficiency anemia, unspecified: Secondary | ICD-10-CM

## 2016-06-03 DIAGNOSIS — R809 Proteinuria, unspecified: Secondary | ICD-10-CM

## 2016-06-03 MED ORDER — FERROUS SULFATE 325 (65 FE) MG PO TBEC: 325.0000 mg | DELAYED_RELEASE_TABLET | Freq: Three times a day (TID) | ORAL | 2 refills | Status: DC

## 2016-06-03 MED FILL — FERROUS SULFATE 325 MG TAB: 325 (65 FE) | 30 days supply | Qty: 90 | Fill #0

## 2016-06-03 NOTE — Telephone Encounter (Signed)
CMA call patient to go over lab results  Patient did not answer on home # CMA try mobile # did not answer but CMA left a VM stating the reason of the call & to call back

## 2016-06-03 NOTE — Telephone Encounter (Signed)
-----   Message from Alfonse Spruce, [place] sent at 06/03/2016 11:09 AM EDT ----- Labs indicate your WBC is low. WBC help to fight off infections. Avoid contact with others who are ill, practice good handwashing, avoid fresh cut flowers and fruits. Follow up with referrals. Labs indicate you are anemic. You will be prescribed an iron supplement. Recommend lab recheck in 1 month.  Increase your dietary iron intake. Good sources of iron include dark green leafy vegetables, meats, beans, and iron fortified cereals. Microalbumin/creatinine ratio level was elevated. This tests for protein in your urine that could indicate early signs of kidney damage. Take your medications for blood pressure. Recommend recheck in 3 months.

## 2016-06-12 ENCOUNTER — Ambulatory Visit: Payer: Self-pay | Admitting: Family Medicine

## 2016-06-12 ENCOUNTER — Encounter (HOSPITAL_BASED_OUTPATIENT_CLINIC_OR_DEPARTMENT_OTHER): Payer: No Typology Code available for payment source | Attending: Surgery

## 2016-06-12 DIAGNOSIS — Z79899 Other long term (current) drug therapy: Secondary | ICD-10-CM | POA: Insufficient documentation

## 2016-06-12 DIAGNOSIS — B079 Viral wart, unspecified: Secondary | ICD-10-CM | POA: Insufficient documentation

## 2016-06-12 DIAGNOSIS — I10 Essential (primary) hypertension: Secondary | ICD-10-CM | POA: Insufficient documentation

## 2016-06-12 DIAGNOSIS — L608 Other nail disorders: Secondary | ICD-10-CM | POA: Insufficient documentation

## 2016-06-12 DIAGNOSIS — Z7984 Long term (current) use of oral hypoglycemic drugs: Secondary | ICD-10-CM | POA: Insufficient documentation

## 2016-06-12 DIAGNOSIS — Z8509 Personal history of malignant neoplasm of other digestive organs: Secondary | ICD-10-CM | POA: Insufficient documentation

## 2016-06-12 DIAGNOSIS — E11628 Type 2 diabetes mellitus with other skin complications: Secondary | ICD-10-CM | POA: Insufficient documentation

## 2016-06-13 ENCOUNTER — Other Ambulatory Visit: Payer: Self-pay | Admitting: Family Medicine

## 2016-06-13 ENCOUNTER — Ambulatory Visit: Payer: Self-pay | Attending: Family Medicine | Admitting: Family Medicine

## 2016-06-13 VITALS — BP 127/86 | HR 69 | Temp 98.0°F | Resp 18 | Ht 63.0 in | Wt 142.8 lb

## 2016-06-13 DIAGNOSIS — C49A2 Gastrointestinal stromal tumor of stomach: Secondary | ICD-10-CM | POA: Insufficient documentation

## 2016-06-13 DIAGNOSIS — H538 Other visual disturbances: Secondary | ICD-10-CM

## 2016-06-13 DIAGNOSIS — Z7984 Long term (current) use of oral hypoglycemic drugs: Secondary | ICD-10-CM | POA: Insufficient documentation

## 2016-06-13 DIAGNOSIS — G8929 Other chronic pain: Secondary | ICD-10-CM

## 2016-06-13 DIAGNOSIS — B070 Plantar wart: Secondary | ICD-10-CM | POA: Insufficient documentation

## 2016-06-13 DIAGNOSIS — Z23 Encounter for immunization: Secondary | ICD-10-CM

## 2016-06-13 DIAGNOSIS — R7303 Prediabetes: Secondary | ICD-10-CM | POA: Insufficient documentation

## 2016-06-13 DIAGNOSIS — I10 Essential (primary) hypertension: Secondary | ICD-10-CM | POA: Insufficient documentation

## 2016-06-13 DIAGNOSIS — R51 Headache: Secondary | ICD-10-CM | POA: Insufficient documentation

## 2016-06-13 DIAGNOSIS — E876 Hypokalemia: Secondary | ICD-10-CM

## 2016-06-13 DIAGNOSIS — E119 Type 2 diabetes mellitus without complications: Secondary | ICD-10-CM | POA: Insufficient documentation

## 2016-06-13 DIAGNOSIS — Z0000 Encounter for general adult medical examination without abnormal findings: Secondary | ICD-10-CM

## 2016-06-13 LAB — POCT GLYCOSYLATED HEMOGLOBIN (HGB A1C): Hemoglobin A1C: 5.8

## 2016-06-13 LAB — GLUCOSE, POCT (MANUAL RESULT ENTRY): POC GLUCOSE: 97 mg/dL (ref 70–99)

## 2016-06-13 MED ORDER — PNEUMOCOCCAL 13-VAL CONJ VACC IM SUSP
0.5000 mL | INTRAMUSCULAR | Status: AC
  Administered 2016-06-13: 0.5 mL via INTRAMUSCULAR

## 2016-06-13 MED ORDER — ACETAMINOPHEN 500 MG PO TABS: 500.0000 mg | ORAL_TABLET | Freq: Four times a day (QID) | ORAL | 0 refills | Status: DC | PRN

## 2016-06-13 MED ORDER — TRAMADOL HCL 50 MG PO TABS: 50.0000 mg | ORAL_TABLET | Freq: Three times a day (TID) | ORAL | 0 refills | Status: AC | PRN

## 2016-06-13 MED FILL — traMADol HCL 50 MG TABS: 50 | 13 days supply | Qty: 40 | Fill #0

## 2016-06-13 NOTE — Progress Notes (Signed)
Subjective:  Patient ID: Katie Howell, female    DOB: 11-Jan-1951  Age: 66 y.o. MRN: [id number]  CC: Hypertension and Diabetes   HPI Katie Howell presents for   History of metastatic: Patient had medical records from [facility] released and sent to office. 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  History of Gleevec use. Denies following up since diagnosis. Denies any hematochezia, melena., or weight loss. Reports not following up for history of  GIST in almost 1 year.   Headaches: 4 year history. Reports left sided sharp headache pain. Denies any history of head injury. Reports symptoms of blurred vision. Denies nausea or vomiting, photophobia, or difficulty maintaining her balance. Reports taken Tylenol for headache pain with moderate relief of symptoms.  Right toe lesion: Area to right great toe. No drainage,  open area, or areas of maceration. Reports following up consult.   HTN: History of hypertension. She reports adherence with antihypertensive medications. Denies any chest pain, shortness of breath, swelling of the bilateral lower extremities, or dizziness. She does not check blood pressures at home.  Pre-DM: History of prediabetes. She reports adherence with metformin. She denies regularly checking her blood sugars at home.   Outpatient Medications Prior to Visit  Medication Sig Dispense Refill  . bacitracin 500 UNIT/GM ointment Apply 1 application topically 2 (two) times daily. To affected area, for 14 days. 28 g 0  . cephALEXin (KEFLEX) 500 MG capsule Take 1 capsule (500 mg total) by mouth 2 (two) times daily. 28 capsule 0  . ferrous sulfate 325 (65 FE) MG EC tablet Take 1 tablet (325 mg total) by mouth 3 (three) times daily with meals. 90 tablet 2  . glucose blood test strip Use as instructed 100 each 12  . levofloxacin (LEVAQUIN) 750 MG tablet Take 0.5 tablets (375 mg total) by mouth daily. (Patient not taking: Reported on  03/25/2014) 7 tablet 0  . lisinopril-hydrochlorothiazide (PRINZIDE,ZESTORETIC) 20-25 MG tablet Take 1 tablet by mouth daily. For blood pressure 90 tablet 0  . metFORMIN (GLUCOPHAGE) 500 MG tablet Take 1 tablet (500 mg total) by mouth 2 (two) times daily with a meal. 180 tablet 0  . potassium chloride (K-DUR) 10 MEQ tablet TAKE ONE TABLET BY MOUTH ONCE DAILY 30 tablet 0  . potassium chloride SA (K-DUR,KLOR-CON) 10 MEQ tablet Take 1 tablet (10 mEq total) by mouth daily. 30 tablet 0  . simvastatin (ZOCOR) 20 MG tablet Take 1 tablet (20 mg total) by mouth at bedtime. For cholesterol 90 tablet 3  . Vitamin D, Ergocalciferol, (DRISDOL) 50000 UNITS CAPS capsule Take 1 capsule (50,000 Units total) by mouth every 7 (seven) days. (Patient not taking: Reported on 03/25/2014) 30 capsule 7   Facility-Administered Medications Prior to Visit  Medication Dose Route Frequency Provider Last Rate Last Dose  . cloNIDine (CATAPRES) tablet 0.2 mg  0.2 mg Oral Once Alfonse Spruce, FNP        ROS Review of Systems  Constitutional: Negative.   Eyes: Positive for visual disturbance.  Respiratory: Negative.   Cardiovascular: Negative.   Gastrointestinal:       History of malignant GIST.  Skin: Positive for wound (left toe).  Neurological: Positive for headaches.    Objective:  BP 127/86 (BP Location: Left Arm, Patient Position: Sitting, Cuff Size: Normal)   Pulse 69   Temp 98 F (36.7 C) (Oral)   Resp 18   Ht 5\' 3"  (1.6 m)  Wt 142 lb 12.8 oz (64.8 kg)   LMP 09/28/2012   SpO2 98%   BMI 25.30 kg/m   BP/Weight 06/13/2016 05/29/2016 6/56/8127  Systolic BP 517 001 749  Diastolic BP 86 449 675  Wt. (Lbs) 142.8 143.4 154  BMI 25.3 26.23 27.29     Physical Exam  Constitutional: She is oriented to person, place, and time. She appears well-developed and well-nourished.  HENT:  Head: Normocephalic and atraumatic.  Right Ear: External ear normal.  Left Ear: External ear normal.  Nose: Nose normal.    Mouth/Throat: Oropharynx is clear and moist.  Eyes: Conjunctivae and EOM are normal. Pupils are equal, round, and reactive to light.  Neck: Normal range of motion. Neck supple.  Cardiovascular: Normal rate, regular rhythm, normal heart sounds and intact distal pulses.   Pulmonary/Chest: Effort normal and breath sounds normal.  Abdominal: Soft. Bowel sounds are normal.  Musculoskeletal: Normal range of motion.  Neurological: She is alert and oriented to person, place, and time. She has normal reflexes.  Skin: Skin is warm and dry.  Condyloma to left great toe. No maceration or drainage present.   Psychiatric: She has a normal mood and affect.  Nursing note and vitals reviewed.   Assessment & Plan:   Problem List Items Addressed This Visit      Cardiovascular and Mediastinum   Essential hypertension    BP well controlled on current medication and dosage.    Other   Prediabetes   Relevant Orders   Glucose (CBG) (Completed)   HgB A1c (Completed)    Other Visit Diagnoses    Malignant gastrointestinal stromal tumor (GIST) of stomach ([place])    -  Primary   Relevant Orders   Ambulatory referral to Gastroenterology   Ambulatory referral to Oncology   CT ABDOMEN W WO CONTRAST   Chronic left-sided headaches       Due to history of malignant GIST and patient's history of not following up with oncologist or GI for close to    1 year. Recommend imaging of the brain to rule out metastases.   Relevant Medications   traMADol (ULTRAM) 50 MG tablet   acetaminophen (TYLENOL) 500 MG tablet   Other Relevant Orders   MR Brain W Wo Contrast   Blurry vision, bilateral       Relevant Orders   Ambulatory referral to Ophthalmology   Plantar wart       Other Relevant Orders   Ambulatory referral to Dermatology   Healthcare maintenance       Relevant Medications   pneumococcal 13-valent conjugate vaccine (PREVNAR 13) injection 0.5 mL (Completed)   Other Relevant Orders   MM SCREENING BREAST  TOMO BILATERAL   Hepatitis C Antibody (Completed)   Tdap vaccine greater than or equal to 7yo IM (Completed)   Ambulatory referral to Ophthalmology      Meds ordered this encounter  Medications  . traMADol (ULTRAM) 50 MG tablet    Sig: Take 1 tablet (50 mg total) by mouth every 8 (eight) hours as needed for severe pain.    Dispense:  40 tablet    Refill:  0    Order Specific Question:   Supervising Provider    Answer:   Tresa Garter [id number]  . acetaminophen (TYLENOL) 500 MG tablet    Sig: Take 1 tablet (500 mg total) by mouth every 6 (six) hours as needed.    Refill:  0    Order Specific Question:   Supervising Provider  AnswerTresa Garter [id number]  . pneumococcal 13-valent conjugate vaccine (PREVNAR 13) injection 0.5 mL    Follow-up: Return in about 2 months (around 08/13/2016) for DM/HTN.   Alfonse Spruce FNP

## 2016-06-13 NOTE — Patient Instructions (Signed)
General Headache Without Cause A headache is pain or discomfort felt around the head or neck area. There are many causes and types of headaches. In some cases, the cause may not be found. Follow these instructions at home: Managing pain   Take over-the-counter and prescription medicines only as told by your doctor.  Lie down in a dark, quiet room when you have a headache.  If directed, apply ice to the head and neck area:  Put ice in a plastic bag.  Place a towel between your skin and the bag.  Leave the ice on for 20 minutes, 2-3 times per day.  Use a heating pad or hot shower to apply heat to the head and neck area as told by your doctor.  Keep lights dim if bright lights bother you or make your headaches worse. Eating and drinking   Eat meals on a regular schedule.  Lessen how much alcohol you drink.  Lessen how much caffeine you drink, or stop drinking caffeine. General instructions   Keep all follow-up visits as told by your doctor. This is important.  Keep a journal to find out if certain things bring on headaches. For example, write down:  What you eat and drink.  How much sleep you get.  Any change to your diet or medicines.  Relax by getting a massage or doing other relaxing activities.  Lessen stress.  Sit up straight. Do not tighten (tense) your muscles.  Do not use tobacco products. This includes cigarettes, chewing tobacco, or e-cigarettes. If you need help quitting, ask your doctor.  Exercise regularly as told by your doctor.  Get enough sleep. This often means 7-9 hours of sleep. Contact a doctor if:  Your symptoms are not helped by medicine.  You have a headache that feels different than the other headaches.  You feel sick to your stomach (nauseous) or you throw up (vomit).  You have a fever. Get help right away if:  Your headache becomes really bad.  You keep throwing up.  You have a stiff neck.  You have trouble seeing.  You have  trouble speaking.  You have pain in the eye or ear.  Your muscles are weak or you lose muscle control.  You lose your balance or have trouble walking.  You feel like you will pass out (faint) or you pass out.  You have confusion. This information is not intended to replace advice given to you by your health care provider. Make sure you discuss any questions you have with your health care provider. Document Released: 11/21/2007 Document Revised: 07/20/2015 Document Reviewed: 06/06/2014 Elsevier Interactive Patient Education  2017 [employer].

## 2016-06-13 NOTE — Progress Notes (Signed)
Patient f/up with HTN & DM  Patient complains about headaches that she been having it everyday  Patient has taking her medications for today  Patient has not eaten for today

## 2016-06-14 LAB — HEPATITIS C ANTIBODY: Hep C Virus Ab: <0.1 s/co ratio (ref 0.0–0.9)

## 2016-06-18 ENCOUNTER — Telehealth: Payer: Self-pay

## 2016-06-18 NOTE — Telephone Encounter (Signed)
CMA call patient regarding results  CMA spoke with patient  daughter   Daughter Verify DOB  Patient daughter was aware and understood

## 2016-06-18 NOTE — Telephone Encounter (Signed)
-----   Message from Alfonse Spruce, [place] sent at 06/18/2016  5:45 AM EDT ----- -Hepatitis C is negative.

## 2016-06-24 ENCOUNTER — Ambulatory Visit (HOSPITAL_COMMUNITY)
Admission: RE | Admit: 2016-06-24 | Discharge: 2016-06-24 | Disposition: A | Discharge status: Home / Self Care | Payer: Self-pay | Source: Ambulatory Visit | Attending: Family Medicine | Admitting: Family Medicine

## 2016-06-24 ENCOUNTER — Encounter (HOSPITAL_COMMUNITY): Payer: Self-pay

## 2016-06-24 ENCOUNTER — Other Ambulatory Visit: Payer: Self-pay | Admitting: Family Medicine

## 2016-06-24 DIAGNOSIS — K769 Liver disease, unspecified: Secondary | ICD-10-CM | POA: Insufficient documentation

## 2016-06-24 DIAGNOSIS — K439 Ventral hernia without obstruction or gangrene: Secondary | ICD-10-CM | POA: Insufficient documentation

## 2016-06-24 DIAGNOSIS — I700 Atherosclerosis of aorta: Secondary | ICD-10-CM | POA: Insufficient documentation

## 2016-06-24 DIAGNOSIS — G8929 Other chronic pain: Secondary | ICD-10-CM

## 2016-06-24 DIAGNOSIS — R51 Headache: Secondary | ICD-10-CM

## 2016-06-24 DIAGNOSIS — I6782 Cerebral ischemia: Secondary | ICD-10-CM | POA: Insufficient documentation

## 2016-06-24 DIAGNOSIS — C49A2 Gastrointestinal stromal tumor of stomach: Secondary | ICD-10-CM

## 2016-06-24 MED ORDER — IOPAMIDOL (ISOVUE-300) INJECTION 61%
100.0000 mL | Freq: Once | INTRAVENOUS | Status: AC | PRN
  Administered 2016-06-24: 100 mL via INTRAVENOUS

## 2016-06-24 MED ORDER — GADOBENATE DIMEGLUMINE 529 MG/ML IV SOLN
15.0000 mL | Freq: Once | INTRAVENOUS | Status: AC | PRN
  Administered 2016-06-24: 15 mL via INTRAVENOUS

## 2016-06-25 ENCOUNTER — Other Ambulatory Visit: Payer: Self-pay | Admitting: Family Medicine

## 2016-06-25 DIAGNOSIS — E782 Mixed hyperlipidemia: Secondary | ICD-10-CM

## 2016-06-25 MED ORDER — ATORVASTATIN CALCIUM 20 MG PO TABS: 20.0000 mg | ORAL_TABLET | Freq: Every day | ORAL | 1 refills | Status: AC

## 2016-06-25 MED ORDER — ASPIRIN EC 81 MG PO TBEC: 81.0000 mg | DELAYED_RELEASE_TABLET | Freq: Every day | ORAL | 3 refills | Status: AC

## 2016-06-26 MED FILL — ATORVASTATIN 20 MG TABLET: 20 | 30 days supply | Qty: 30 | Fill #0

## 2016-06-27 ENCOUNTER — Telehealth: Payer: Self-pay

## 2016-06-27 NOTE — Telephone Encounter (Signed)
-----   Message from Alfonse Spruce, [place] sent at 06/25/2016  5:22 PM EDT ----- No metastatic disease of the brain or brain abnormality.  Mild ischemic changes to blood vessels. You will be prescribed atorvastatin and aspirin to help lower risk of stroke.

## 2016-06-27 NOTE — Telephone Encounter (Signed)
CMA call patient regarding lab results  Patient did not answer & no VM set up

## 2016-07-02 ENCOUNTER — Other Ambulatory Visit: Payer: Self-pay | Admitting: Family Medicine

## 2016-07-02 ENCOUNTER — Telehealth: Payer: Self-pay

## 2016-07-02 DIAGNOSIS — C169 Malignant neoplasm of stomach, unspecified: Secondary | ICD-10-CM

## 2016-07-02 DIAGNOSIS — C799 Secondary malignant neoplasm of unspecified site: Principal | ICD-10-CM

## 2016-07-02 DIAGNOSIS — K769 Liver disease, unspecified: Secondary | ICD-10-CM

## 2016-07-02 NOTE — Telephone Encounter (Signed)
CMA call regarding results  Patient did not answer but left a VM stating the reason of the call & to cal back

## 2016-07-02 NOTE — Telephone Encounter (Signed)
-----   Message from Alfonse Spruce, [place] sent at 07/02/2016  3:29 PM EDT ----- CT Abdomen imaging results consistent with patient's history of gastric cancer. Liver shows some lesions that are concerning for potential metastatic lesions. MRI of abdomen is recommended. Follow up with referrals to GI and Oncology. Small hernia present Aortic blood vessel show narrowing due to plaque buildup on the artery walls. This can increase your risk of heart disease overtime. Continue to take your aspirin and atorvastatin to help lower risk.

## 2016-07-04 ENCOUNTER — Ambulatory Visit (INDEPENDENT_AMBULATORY_CARE_PROVIDER_SITE_OTHER): Payer: No Typology Code available for payment source

## 2016-07-04 ENCOUNTER — Ambulatory Visit (INDEPENDENT_AMBULATORY_CARE_PROVIDER_SITE_OTHER): Payer: No Typology Code available for payment source | Admitting: Podiatry

## 2016-07-04 ENCOUNTER — Encounter: Payer: Self-pay | Admitting: Podiatry

## 2016-07-04 VITALS — BP 158/112 | HR 92

## 2016-07-04 DIAGNOSIS — T84498A Other mechanical complication of other internal orthopedic devices, implants and grafts, initial encounter: Secondary | ICD-10-CM

## 2016-07-04 DIAGNOSIS — L989 Disorder of the skin and subcutaneous tissue, unspecified: Secondary | ICD-10-CM

## 2016-07-04 DIAGNOSIS — R52 Pain, unspecified: Secondary | ICD-10-CM

## 2016-07-04 NOTE — Telephone Encounter (Signed)
Spoke with patient's daughter and notified her of Dr.Stark's recommendation.

## 2016-07-04 NOTE — Telephone Encounter (Signed)
Records have been received and placed on Dr.Stark's desk for review. °

## 2016-07-04 NOTE — Progress Notes (Signed)
   Subjective:    Patient ID: Katie Howell, female    DOB: 1950-03-03, 66 y.o.   MRN: [id number]  HPI this patient presents to the office with chief complaint of a skin lesion that is present on her left big toe. She states that she develops a skin lesion that seems to come and go over time. She says that in 2004 in [place]  she received a snake bite. She says since that time she develops a great toe corn. She says that the corn is more painful when she walks and wears her shoes. She says when she does work on the corn the skin lesion bleeds.  She presents the office today for an evaluation of this skin lesion great toe left foot    Review of Systems  All other systems reviewed and are negative.      Objective:   Physical Exam GENERAL APPEARANCE: Alert, conversant. Appropriately groomed. No acute distress.  VASCULAR: Pedal pulses are  palpable at  [facility] and PT bilateral.  Capillary refill time is immediate to all digits,  Normal temperature gradient.  NEUROLOGIC: sensation is normal to 5.07 monofilament at 5/5 sites bilateral.  Light touch is intact bilateral, Muscle strength normal.  MUSCULOSKELETAL: acceptable muscle strength, tone and stability bilateral.  Intrinsic muscluature intact bilateral.  Rectus appearance of foot and digits noted bilateral.  IPJ left hallux is fused with no motion noted.    DERMATOLOGIC: skin color, texture, and turgor are within normal limits.  Well defined well circumscribed skin lesion is noted on the lateral aspect of the lateral border of the left great toe. There is desquamation noted around this area. No palpable pain noted.           Assessment & Plan:  Verrucae left hallux  Fusion IPJ left hallux.  IE  X-rays were taken and there is no evidence of an IPJ joint, left hallux. There is bone to bone contact between the distal proximal in the proximal distal phalanx left hallux. There is malalignment of  the distal phalanx which is  mildly medial on the  proximal phalanx.  Based on the description of this patient and her past history. I believe she may have suffered an infection in the IPJ of the left hallux following her snake bite in [place].  After I determined there was no cystic formation or infection to the bone,  I proceeded debride  the soft tissue lesion.  The lesion immediately began to bleed.  I felt she had developed a benign skin lesion and or wart  on the dorsum of the left hallux.  I discussed this lesion with this patient and her daughter and since it was nonpainful and came and went, she decided to live with this skin lesion. She says she will return to the office if she desires to have the lesion excised. Return to clinic when necessary   Gardiner Barefoot DPM

## 2016-07-04 NOTE — Telephone Encounter (Signed)
Records reviewed today by Dr. Fuller Plan.  Patient needs to see Oncology and if they need GI assistance we could schedule at that time.  He recommends that they see Oncology first and here at the request of Oncology.

## 2016-08-05 ENCOUNTER — Encounter: Payer: Self-pay | Admitting: Family Medicine

## 2016-08-19 ENCOUNTER — Ambulatory Visit: Payer: Self-pay | Admitting: Family Medicine

## 2016-08-19 NOTE — Progress Notes (Deleted)
   Subjective:  Patient ID: Katie Howell, female    DOB: December 19, 1950  Age: 66 y.o. MRN: [id number]  CC: No chief complaint on file.   HPI Trenisha Dipaola presents for   Outpatient Medications Prior to Visit  Medication Sig Dispense Refill  . acetaminophen (TYLENOL) 500 MG tablet Take 1 tablet (500 mg total) by mouth every 6 (six) hours as needed.  0  . aspirin EC 81 MG tablet Take 1 tablet (81 mg total) by mouth daily. (Patient not taking: Reported on 07/04/2016) 90 tablet 3  . atorvastatin (LIPITOR) 20 MG tablet Take 1 tablet (20 mg total) by mouth daily. 90 tablet 1  . bacitracin 500 UNIT/GM ointment Apply 1 application topically 2 (two) times daily. To affected area, for 14 days. (Patient not taking: Reported on 07/04/2016) 28 g 0  . cephALEXin (KEFLEX) 500 MG capsule Take 1 capsule (500 mg total) by mouth 2 (two) times daily. 28 capsule 0  . ferrous sulfate 325 (65 FE) MG EC tablet Take 1 tablet (325 mg total) by mouth 3 (three) times daily with meals. 90 tablet 2  . glucose blood test strip Use as instructed 100 each 12  . levofloxacin (LEVAQUIN) 750 MG tablet Take 0.5 tablets (375 mg total) by mouth daily. 7 tablet 0  . lisinopril-hydrochlorothiazide (PRINZIDE,ZESTORETIC) 20-25 MG tablet Take 1 tablet by mouth daily. For blood pressure 90 tablet 0  . metFORMIN (GLUCOPHAGE) 500 MG tablet Take 1 tablet (500 mg total) by mouth 2 (two) times daily with a meal. 180 tablet 0  . potassium chloride (K-DUR) 10 MEQ tablet TAKE ONE TABLET BY MOUTH ONCE DAILY 30 tablet 0  . potassium chloride SA (K-DUR,KLOR-CON) 10 MEQ tablet Take 1 tablet (10 mEq total) by mouth daily. 30 tablet 0  . traMADol (ULTRAM) 50 MG tablet Take 1 tablet (50 mg total) by mouth every 8 (eight) hours as needed for severe pain. 40 tablet 0  . Vitamin D, Ergocalciferol, (DRISDOL) 50000 UNITS CAPS capsule Take 1 capsule (50,000 Units total) by mouth every 7 (seven) days. 30 capsule 7   Facility-Administered Medications Prior to Visit    Medication Dose Route Frequency Provider Last Rate Last Dose  . cloNIDine (CATAPRES) tablet 0.2 mg  0.2 mg Oral Once Reia Viernes, Maylon Peppers, FNP        ROS Review of Systems  Constitutional: Negative.   HENT: Negative.   Eyes: Negative.   Respiratory: Negative.   Cardiovascular: Negative.   Gastrointestinal: Negative.   Genitourinary: Negative.   Musculoskeletal: Negative.   Skin: Negative.   Neurological: Negative.      Objective:  LMP 09/28/2012   BP/Weight 07/04/2016 7/40/8144 09/25/8561  Systolic BP 149 702 637  Diastolic BP 858 86 850  Wt. (Lbs) - 142.8 143.4  BMI - 25.3 26.23     Physical Exam   Assessment & Plan:   Problem List Items Addressed This Visit      Cardiovascular and Mediastinum   Essential hypertension - Primary     Other   Prediabetes    Other Visit Diagnoses    History of malignant gastrointestinal stromal tumor (GIST)       Iron deficiency anemia, unspecified iron deficiency anemia type          No orders of the defined types were placed in this encounter.   Follow-up: No Follow-up on file.   Alfonse Spruce FNP

## 2016-09-03 ENCOUNTER — Ambulatory Visit: Payer: Self-pay | Attending: Family Medicine | Admitting: Family Medicine

## 2016-09-03 ENCOUNTER — Encounter: Payer: Self-pay | Admitting: Family Medicine

## 2016-09-03 VITALS — BP 145/93 | HR 76 | Temp 97.9°F | Resp 18 | Ht 62.0 in | Wt 139.2 lb

## 2016-09-03 DIAGNOSIS — Z1231 Encounter for screening mammogram for malignant neoplasm of breast: Secondary | ICD-10-CM

## 2016-09-03 DIAGNOSIS — Z8509 Personal history of malignant neoplasm of other digestive organs: Secondary | ICD-10-CM

## 2016-09-03 DIAGNOSIS — Z1239 Encounter for other screening for malignant neoplasm of breast: Secondary | ICD-10-CM

## 2016-09-03 DIAGNOSIS — I10 Essential (primary) hypertension: Secondary | ICD-10-CM | POA: Insufficient documentation

## 2016-09-03 DIAGNOSIS — R7303 Prediabetes: Secondary | ICD-10-CM | POA: Insufficient documentation

## 2016-09-03 DIAGNOSIS — Z79899 Other long term (current) drug therapy: Secondary | ICD-10-CM | POA: Insufficient documentation

## 2016-09-03 DIAGNOSIS — Z7982 Long term (current) use of aspirin: Secondary | ICD-10-CM | POA: Insufficient documentation

## 2016-09-03 DIAGNOSIS — R10814 Left lower quadrant abdominal tenderness: Secondary | ICD-10-CM

## 2016-09-03 DIAGNOSIS — N3000 Acute cystitis without hematuria: Secondary | ICD-10-CM

## 2016-09-03 LAB — POCT URINALYSIS DIPSTICK
Bilirubin, UA: NEGATIVE
Blood, UA: NEGATIVE
Glucose, UA: NEGATIVE
Ketones, UA: NEGATIVE
NITRITE UA: NEGATIVE
PH UA: 6.5 (ref 5.0–8.0)
PROTEIN UA: NEGATIVE
Spec Grav, UA: <=1.005 — AB (ref 1.010–1.025)
Urobilinogen, UA: 0.2 E.U./dL

## 2016-09-03 LAB — GLUCOSE, POCT (MANUAL RESULT ENTRY): POC Glucose: 108 mg/dl — AB (ref 70–99)

## 2016-09-03 LAB — POCT GLYCOSYLATED HEMOGLOBIN (HGB A1C): Hemoglobin A1C: 6

## 2016-09-03 MED ORDER — LISINOPRIL-HYDROCHLOROTHIAZIDE 20-25 MG PO TABS: 1.0000 | ORAL_TABLET | Freq: Every day | ORAL | 0 refills | Status: DC

## 2016-09-03 MED ORDER — CIPROFLOXACIN HCL 250 MG PO TABS: 250.0000 mg | ORAL_TABLET | Freq: Two times a day (BID) | ORAL | 0 refills | Status: AC

## 2016-09-03 MED ORDER — METFORMIN HCL 500 MG PO TABS: 500.0000 mg | ORAL_TABLET | Freq: Two times a day (BID) | ORAL | 0 refills | Status: DC

## 2016-09-03 MED ORDER — AMLODIPINE BESYLATE 5 MG PO TABS: 5.0000 mg | ORAL_TABLET | Freq: Every day | ORAL | 0 refills | Status: DC

## 2016-09-03 MED FILL — LISINOPRIL-HCTZ 20-25 MG TA: 20-25 | 30 days supply | Qty: 30 | Fill #0

## 2016-09-03 MED FILL — ?METFORMIN HCL 500MG TABLET: 500 | 30 days supply | Qty: 60 | Fill #0

## 2016-09-03 MED FILL — AMLODIPINE BESYLATE 5 MG TA: 5 | 30 days supply | Qty: 30 | Fill #0

## 2016-09-03 NOTE — Patient Instructions (Addendum)
Follow up with referrals.    DASH Eating Plan DASH stands for "Dietary Approaches to Stop Hypertension." The DASH eating plan is a healthy eating plan that has been shown to reduce high blood pressure (hypertension). It may also reduce your risk for type 2 diabetes, heart disease, and stroke. The DASH eating plan may also help with weight loss. What are tips for following this plan? General guidelines  Avoid eating more than 2,300 mg (milligrams) of salt (sodium) a day. If you have hypertension, you may need to reduce your sodium intake to 1,500 mg a day.  Limit alcohol intake to no more than 1 drink a day for nonpregnant women and 2 drinks a day for men. One drink equals 12 oz of beer, 5 oz of wine, or 1 oz of hard liquor.  Work with your health care provider to maintain a healthy body weight or to lose weight. Ask what an ideal weight is for you.  Get at least 30 minutes of exercise that causes your heart to beat faster (aerobic exercise) most days of the week. Activities may include walking, swimming, or biking.  Work with your health care provider or diet and nutrition specialist (dietitian) to adjust your eating plan to your individual calorie needs. Reading food labels  Check food labels for the amount of sodium per serving. Choose foods with less than 5 percent of the Daily Value of sodium. Generally, foods with less than 300 mg of sodium per serving fit into this eating plan.  To find whole grains, look for the word "whole" as the first word in the ingredient list. Shopping  Buy products labeled as "low-sodium" or "no salt added."  Buy fresh foods. Avoid canned foods and premade or frozen meals. Cooking  Avoid adding salt when cooking. Use salt-free seasonings or herbs instead of table salt or sea salt. Check with your health care provider or pharmacist before using salt substitutes.  Do not fry foods. Cook foods using healthy methods such as baking, boiling, grilling, and  broiling instead.  Cook with heart-healthy oils, such as olive, canola, soybean, or sunflower oil. Meal planning   Eat a balanced diet that includes: ? 5 or more servings of fruits and vegetables each day. At each meal, try to fill half of your plate with fruits and vegetables. ? Up to 6-8 servings of whole grains each day. ? Less than 6 oz of lean meat, poultry, or fish each day. A 3-oz serving of meat is about the same size as a deck of cards. One egg equals 1 oz. ? 2 servings of low-fat dairy each day. ? A serving of nuts, seeds, or beans 5 times each week. ? Heart-healthy fats. Healthy fats called Omega-3 fatty acids are found in foods such as flaxseeds and coldwater fish, like sardines, salmon, and mackerel.  Limit how much you eat of the following: ? Canned or prepackaged foods. ? Food that is high in trans fat, such as fried foods. ? Food that is high in saturated fat, such as fatty meat. ? Sweets, desserts, sugary drinks, and other foods with added sugar. ? Full-fat dairy products.  Do not salt foods before eating.  Try to eat at least 2 vegetarian meals each week.  Eat more home-cooked food and less restaurant, buffet, and fast food.  When eating at a restaurant, ask that your food be prepared with less salt or no salt, if possible. What foods are recommended? The items listed may not be a complete  list. Talk with your dietitian about what dietary choices are best for you. Grains Whole-grain or whole-wheat bread. Whole-grain or whole-wheat pasta. Brown rice. Modena Morrow. Bulgur. Whole-grain and low-sodium cereals. Pita bread. Low-fat, low-sodium crackers. Whole-wheat flour tortillas. Vegetables Fresh or frozen vegetables (raw, steamed, roasted, or grilled). Low-sodium or reduced-sodium tomato and vegetable juice. Low-sodium or reduced-sodium tomato sauce and tomato paste. Low-sodium or reduced-sodium canned vegetables. Fruits All fresh, dried, or frozen fruit. Canned  fruit in natural juice (without added sugar). Meat and other protein foods Skinless chicken or [place]. Ground chicken or [place]. Pork with fat trimmed off. Fish and seafood. Egg whites. Dried beans, peas, or lentils. Unsalted nuts, nut butters, and seeds. Unsalted canned beans. Lean cuts of beef with fat trimmed off. Low-sodium, lean deli meat. Dairy Low-fat (1%) or fat-free (skim) milk. Fat-free, low-fat, or reduced-fat cheeses. Nonfat, low-sodium ricotta or cottage cheese. Low-fat or nonfat yogurt. Low-fat, low-sodium cheese. Fats and oils Soft margarine without trans fats. Vegetable oil. Low-fat, reduced-fat, or light mayonnaise and salad dressings (reduced-sodium). Canola, safflower, olive, soybean, and sunflower oils. Avocado. Seasoning and other foods Herbs. Spices. Seasoning mixes without salt. Unsalted popcorn and pretzels. Fat-free sweets. What foods are not recommended? The items listed may not be a complete list. Talk with your dietitian about what dietary choices are best for you. Grains Baked goods made with fat, such as croissants, muffins, or some breads. Dry pasta or rice meal packs. Vegetables Creamed or fried vegetables. Vegetables in a cheese sauce. Regular canned vegetables (not low-sodium or reduced-sodium). Regular canned tomato sauce and paste (not low-sodium or reduced-sodium). Regular tomato and vegetable juice (not low-sodium or reduced-sodium). Angie Fava. Olives. Fruits Canned fruit in a light or heavy syrup. Fried fruit. Fruit in cream or butter sauce. Meat and other protein foods Fatty cuts of meat. Ribs. Fried meat. Berniece Salines. Sausage. Bologna and other processed lunch meats. Salami. Fatback. Hotdogs. Bratwurst. Salted nuts and seeds. Canned beans with added salt. Canned or smoked fish. Whole eggs or egg yolks. Chicken or [place] with skin. Dairy Whole or 2% milk, cream, and half-and-half. Whole or full-fat cream cheese. Whole-fat or sweetened yogurt. Full-fat cheese.  Nondairy creamers. Whipped toppings. Processed cheese and cheese spreads. Fats and oils Butter. Stick margarine. Lard. Shortening. Ghee. Bacon fat. Tropical oils, such as coconut, palm kernel, or palm oil. Seasoning and other foods Salted popcorn and pretzels. Onion salt, garlic salt, seasoned salt, table salt, and sea salt. Worcestershire sauce. Tartar sauce. Barbecue sauce. Teriyaki sauce. Soy sauce, including reduced-sodium. Steak sauce. Canned and packaged gravies. Fish sauce. Oyster sauce. Cocktail sauce. Horseradish that you find on the shelf. Ketchup. Mustard. Meat flavorings and tenderizers. Bouillon cubes. Hot sauce and Tabasco sauce. Premade or packaged marinades. Premade or packaged taco seasonings. Relishes. Regular salad dressings. Where to find more information:  National Heart, Lung, and [place]: https://wilson-eaton.com/  American Heart Association: www.heart.org Summary  The DASH eating plan is a healthy eating plan that has been shown to reduce high blood pressure (hypertension). It may also reduce your risk for type 2 diabetes, heart disease, and stroke.  With the DASH eating plan, you should limit salt (sodium) intake to 2,300 mg a day. If you have hypertension, you may need to reduce your sodium intake to 1,500 mg a day.  When on the DASH eating plan, aim to eat more fresh fruits and vegetables, whole grains, lean proteins, low-fat dairy, and heart-healthy fats.  Work with your health care provider or diet and nutrition specialist (dietitian) to  adjust your eating plan to your individual calorie needs. This information is not intended to replace advice given to you by your health care provider. Make sure you discuss any questions you have with your health care provider. Document Released: 01/31/2011 Document Revised: 02/05/2016 Document Reviewed: 02/05/2016 Elsevier Interactive Patient Education  2017 Elsevier Inc.   Urinary Tract Infection, Adult A urinary tract  infection (UTI) is an infection of any part of the urinary tract. The urinary tract includes the:  Kidneys.  Ureters.  Bladder.  Urethra.  These organs make, store, and get rid of pee (urine) in the body. Follow these instructions at home:  Take over-the-counter and prescription medicines only as told by your doctor.  If you were prescribed an antibiotic medicine, take it as told by your doctor. Do not stop taking the antibiotic even if you start to feel better.  Avoid the following drinks: ? Alcohol. ? Caffeine. ? Tea. ? Carbonated drinks.  Drink enough fluid to keep your pee clear or pale yellow.  Keep all follow-up visits as told by your doctor. This is important.  Make sure to: ? Empty your bladder often and completely. Do not to hold pee for long periods of time. ? Empty your bladder before and after sex. ? Wipe from front to back after a bowel movement if you are female. Use each tissue one time when you wipe. Contact a doctor if:  You have back pain.  You have a fever.  You feel sick to your stomach (nauseous).  You throw up (vomit).  Your symptoms do not get better after 3 days.  Your symptoms go away and then come back. Get help right away if:  You have very bad back pain.  You have very bad lower belly (abdominal) pain.  You are throwing up and cannot keep down any medicines or water. This information is not intended to replace advice given to you by your health care provider. Make sure you discuss any questions you have with your health care provider. Document Released: 07/31/2007 Document Revised: 07/20/2015 Document Reviewed: 01/02/2015 Elsevier Interactive Patient Education  [employer].

## 2016-09-03 NOTE — Progress Notes (Signed)
Patient is here for f/up   Patient needs refill on BP & DM medication

## 2016-09-03 NOTE — Progress Notes (Signed)
Subjective:  Patient ID: Katie Howell, female    DOB: [birth year]  Age: 66 y.o. MRN: [id number]  CC: Hypertension and Diabetes   HPI Katie Howell presents for hypertension and pre-diabetes follow up. She is accompanied by her daughter in [employer] [place].  PMH includes history of metastatic GIST, HTN, and pre-diabetes.  She is not exercising and is not adherent to low salt diet. SHe does not check BP at home. Cardiac symptoms none. Patient denies chest pain, chest pressure/discomfort, claudication, dyspnea, near-syncope, palpitations and syncope.  Cardiovascular risk factors: advanced age (older than 51 for men, 76 for women), hypertension, sedentary lifestyle and pre-diabetes. Use of agents associated with hypertension: none. History of target organ damage: none. She reports being without her BP medication for 1 day. She denies any  foot ulcerations, paresthesia of the feet, polydipsia, polyuria, visual disturbances and vomitting.  Evaluation to date has been included: fasting lipid panel, hemoglobin A1C and microalbuminuria.  Home sugars: patient does not check sugars. Treatment to date: more intensive attention to diet which and metformin which has been effective. She reports being without her metformin medication for 1 week. History of metastatic: History of metastatic GIST that was diagnosed in 2016.  History of Gleevec use. Denies following up since diagnosis. Denies any hematochezia, melena., or weight loss. Reports not following up for history of  GIST in almost 1 year. Referrals made to GI and Oncology. It was requested from specialist that patient's daughter follow up regarding paperwork but patient and  daughter did not follow up. Previous workup : CT ABD   Outpatient Medications Prior to Visit  Medication Sig Dispense Refill  . acetaminophen (TYLENOL) 500 MG tablet Take 1 tablet (500 mg total) by mouth every 6 (six) hours as needed.  0  . aspirin EC 81 MG tablet Take 1 tablet (81 mg total) by mouth  daily. (Patient not taking: Reported on 07/04/2016) 90 tablet 3  . atorvastatin (LIPITOR) 20 MG tablet Take 1 tablet (20 mg total) by mouth daily. 90 tablet 1  . bacitracin 500 UNIT/GM ointment Apply 1 application topically 2 (two) times daily. To affected area, for 14 days. (Patient not taking: Reported on 07/04/2016) 28 g 0  . cephALEXin (KEFLEX) 500 MG capsule Take 1 capsule (500 mg total) by mouth 2 (two) times daily. 28 capsule 0  . ferrous sulfate 325 (65 FE) MG EC tablet Take 1 tablet (325 mg total) by mouth 3 (three) times daily with meals. 90 tablet 2  . glucose blood test strip Use as instructed 100 each 12  . levofloxacin (LEVAQUIN) 750 MG tablet Take 0.5 tablets (375 mg total) by mouth daily. 7 tablet 0  . potassium chloride (K-DUR) 10 MEQ tablet TAKE ONE TABLET BY MOUTH ONCE DAILY 30 tablet 0  . potassium chloride SA (K-DUR,KLOR-CON) 10 MEQ tablet Take 1 tablet (10 mEq total) by mouth daily. 30 tablet 0  . traMADol (ULTRAM) 50 MG tablet Take 1 tablet (50 mg total) by mouth every 8 (eight) hours as needed for severe pain. 40 tablet 0  . Vitamin D, Ergocalciferol, (DRISDOL) 50000 UNITS CAPS capsule Take 1 capsule (50,000 Units total) by mouth every 7 (seven) days. 30 capsule 7  . lisinopril-hydrochlorothiazide (PRINZIDE,ZESTORETIC) 20-25 MG tablet Take 1 tablet by mouth daily. For blood pressure 90 tablet 0  . metFORMIN (GLUCOPHAGE) 500 MG tablet Take 1 tablet (500 mg total) by mouth 2 (two) times daily with a meal. 180 tablet 0   Facility-Administered Medications Prior to Visit  Medication Dose Route Frequency Provider Last Rate Last Dose  . cloNIDine (CATAPRES) tablet 0.2 mg  0.2 mg Oral Once Jamiyla Ishee, Maylon Peppers, FNP        ROS Review of Systems  Constitutional: Negative.   Respiratory: Negative.   Cardiovascular: Negative.   Gastrointestinal: Negative.   Skin: Negative.       Objective:  BP (!) 145/93 (BP Location: Left Arm, Patient Position: Sitting, Cuff Size: Normal)    Pulse 76   Temp 97.9 F (36.6 C) (Oral)   Resp 18   Ht _0  (1.575 m)   Wt 139 lb 3.2 oz (63.1 kg)   LMP 09/28/2012   SpO2 98%   BMI 25.46 kg/m   BP/Weight 09/03/2016 07/04/2016 3/41/9622  Systolic BP 297 989 211  Diastolic BP 93 941 86  Wt. (Lbs) 139.2 - 142.8  BMI 25.46 - 25.3    Physical Exam  Constitutional: She appears well-developed and well-nourished.  HENT:  Head: Normocephalic and atraumatic.  Right Ear: External ear normal.  Left Ear: External ear normal.  Nose: Nose normal.  Mouth/Throat: Oropharynx is clear and moist.  Eyes: Pupils are equal, round, and reactive to light. Conjunctivae are normal.  Neck: Normal range of motion. Neck supple. No JVD present.  Cardiovascular: Normal rate, regular rhythm, normal heart sounds and intact distal pulses.   Pulmonary/Chest: Effort normal and breath sounds normal.  Abdominal: Soft. Bowel sounds are normal. There is tenderness (LLQ).  Lymphadenopathy:    She has no cervical adenopathy.  Skin: Skin is warm and dry.  Psychiatric: She has a normal mood and affect.  Nursing note and vitals reviewed.  Assessment & Plan:   Problem List Items Addressed This Visit      Cardiovascular and Mediastinum   Essential hypertension - Primary   Relevant Medications   lisinopril-hydrochlorothiazide (PRINZIDE,ZESTORETIC) 20-25 MG tablet   amLODipine (NORVASC) 5 MG tablet     Other   Prediabetes   Relevant Medications   metFORMIN (GLUCOPHAGE) 500 MG tablet   Other Relevant Orders   Glucose (CBG) (Completed)   HgB A1c (Completed)    Other Visit Diagnoses    History of malignant gastrointestinal stromal tumor (GIST)       History of not following up with referrals. Will attempt to refer again.   Relevant Orders   Ambulatory referral to Oncology   Ambulatory referral to Gastroenterology   CMP14+EGFR   CBC With Differential   POCT urinalysis dipstick (Completed)   Lipid Panel   MR Abdomen W Wo Contrast   Left lower quadrant  abdominal tenderness without rebound tenderness       Relevant Orders   CMP14+EGFR   CBC With Differential   POCT urinalysis dipstick (Completed)   Lipid Panel   Lipase   Screening for breast cancer       Relevant Orders   MM SCREENING BREAST TOMO BILATERAL   Acute cystitis without hematuria       Relevant Medications   ciprofloxacin (CIPRO) 250 MG tablet      Meds ordered this encounter  Medications  . metFORMIN (GLUCOPHAGE) 500 MG tablet    Sig: Take 1 tablet (500 mg total) by mouth 2 (two) times daily with a meal.    Dispense:  180 tablet    Refill:  0    Order Specific Question:   Supervising Provider    Answer:   Tresa Garter [id number]  . lisinopril-hydrochlorothiazide (PRINZIDE,ZESTORETIC) 20-25 MG tablet    Sig: Take 1  tablet by mouth daily. For blood pressure    Dispense:  90 tablet    Refill:  0    Order Specific Question:   Supervising Provider    Answer:   Tresa Garter [id number]  . amLODipine (NORVASC) 5 MG tablet    Sig: Take 1 tablet (5 mg total) by mouth daily.    Dispense:  90 tablet    Refill:  0    Order Specific Question:   Supervising Provider    Answer:   Tresa Garter [id number]  . ciprofloxacin (CIPRO) 250 MG tablet    Sig: Take 1 tablet (250 mg total) by mouth 2 (two) times daily.    Dispense:  6 tablet    Refill:  0    Order Specific Question:   Supervising Provider    Answer:   Tresa Garter [[id number]]    Follow-up: Return in about 2 months (around 11/04/2016), or if symptoms worsen or fail to improve, for HTN/Prediabetes.   Alfonse Spruce FNP

## 2016-09-04 ENCOUNTER — Encounter: Payer: Self-pay | Admitting: Internal Medicine

## 2016-09-10 ENCOUNTER — Ambulatory Visit (HOSPITAL_COMMUNITY)
Admission: RE | Admit: 2016-09-10 | Discharge: 2016-09-10 | Disposition: A | Discharge status: Home / Self Care | Payer: Self-pay | Source: Ambulatory Visit | Attending: Family Medicine | Admitting: Family Medicine

## 2016-09-10 DIAGNOSIS — K7689 Other specified diseases of liver: Secondary | ICD-10-CM | POA: Insufficient documentation

## 2016-09-10 DIAGNOSIS — Z8509 Personal history of malignant neoplasm of other digestive organs: Secondary | ICD-10-CM

## 2016-09-10 DIAGNOSIS — C169 Malignant neoplasm of stomach, unspecified: Secondary | ICD-10-CM | POA: Insufficient documentation

## 2016-09-10 LAB — POCT I-STAT CREATININE: CREATININE: 1 mg/dL (ref 0.44–1.00)

## 2016-09-10 MED ORDER — GADOBENATE DIMEGLUMINE 529 MG/ML IV SOLN
15.0000 mL | Freq: Once | INTRAVENOUS | Status: AC | PRN
  Administered 2016-09-10: 12 mL via INTRAVENOUS

## 2016-10-03 ENCOUNTER — Other Ambulatory Visit: Payer: Self-pay | Admitting: Family Medicine

## 2016-10-03 ENCOUNTER — Telehealth: Payer: Self-pay | Admitting: Family Medicine

## 2016-10-03 DIAGNOSIS — Z8509 Personal history of malignant neoplasm of other digestive organs: Secondary | ICD-10-CM

## 2016-10-03 NOTE — Telephone Encounter (Signed)
CMA call regarding putting in a new referral   Patient verify DOB  Patient was aware and understood

## 2016-10-03 NOTE — Telephone Encounter (Signed)
Patient called stated needs a new referral sent to [facility] because she was not able to make an appt the last time referral was done.  Please follow up with patient.

## 2016-10-03 NOTE — Telephone Encounter (Signed)
Patient called stated needs a new referral sent to [facility] because she was not able to make an appt the last time referral was done.  Please advice?

## 2016-10-03 NOTE — Telephone Encounter (Signed)
New referral placed. I strongly encourage her to follow up. If she misses appointment for any reason she will need to contact the gastroenterology office to schedule appointment.

## 2016-10-04 ENCOUNTER — Other Ambulatory Visit: Payer: Self-pay | Admitting: Family Medicine

## 2016-10-04 DIAGNOSIS — C49A2 Gastrointestinal stromal tumor of stomach: Secondary | ICD-10-CM

## 2016-10-16 MED FILL — ?ATORVASTATIN 20 MG TABLET: 20 | 30 days supply | Qty: 30 | Fill #1

## 2016-10-16 MED FILL — LISINOPRIL-HCTZ 20-25 MG TA: 20-25 | 30 days supply | Qty: 30 | Fill #1

## 2016-10-16 MED FILL — AMLODIPINE BESYLATE 5 MG TA: 5 | 30 days supply | Qty: 30 | Fill #1

## 2016-10-16 MED FILL — ?METFORMIN HCL 500MG TABLET: 500 | 30 days supply | Qty: 60 | Fill #1

## 2016-10-30 ENCOUNTER — Ambulatory Visit: Payer: Self-pay | Admitting: Gastroenterology

## 2016-10-31 ENCOUNTER — Telehealth: Payer: Self-pay | Admitting: Hematology

## 2016-10-31 NOTE — Telephone Encounter (Signed)
Appt has been scheduled for the pt to see Dr. Burr Medico on 9/19 at 230pm. Letter mailed to the pt.

## 2016-11-04 ENCOUNTER — Encounter: Payer: Self-pay | Admitting: Hematology

## 2016-11-13 ENCOUNTER — Other Ambulatory Visit: Payer: Self-pay | Admitting: Nurse Practitioner

## 2016-11-13 ENCOUNTER — Ambulatory Visit: Payer: Self-pay | Admitting: Hematology

## 2016-11-13 ENCOUNTER — Encounter: Payer: Self-pay | Admitting: Nurse Practitioner

## 2016-11-13 DIAGNOSIS — C49A2 Gastrointestinal stromal tumor of stomach: Secondary | ICD-10-CM

## 2016-12-27 ENCOUNTER — Telehealth: Payer: Self-pay | Admitting: Family Medicine

## 2016-12-27 MED FILL — LISINOPRIL-HCTZ 20-25 MG TA: 20-25 | 30 days supply | Qty: 30 | Fill #2

## 2016-12-27 MED FILL — AMLODIPINE BESYLATE 5 MG TA: 5 | 30 days supply | Qty: 30 | Fill #2

## 2016-12-27 MED FILL — ATORVASTATIN 20 MG TABLET: 20 | 30 days supply | Qty: 30 | Fill #2

## 2016-12-27 MED FILL — ?METFORMIN HCL 500MG TABLET: 500 | 30 days supply | Qty: 60 | Fill #2

## 2016-12-27 NOTE — Telephone Encounter (Signed)
CMA call regarding medication refill   CMA aware patient mother that she needs an office visit for refill medication

## 2016-12-27 NOTE — Telephone Encounter (Signed)
Pt called to request a refill for  cephALEXin (KEFLEX) 500 MG capsule   Pls sent it to [facility], please follow up

## 2016-12-27 NOTE — Telephone Encounter (Signed)
Spoke with patient daughter & she stated that she needs all he medication to be refill she was aware that she needs an office visit

## 2016-12-27 NOTE — Telephone Encounter (Signed)
This is an antibiotic medication. No fill without office visit.

## 2016-12-27 NOTE — Telephone Encounter (Signed)
This is an antibiotic and a refill is likely not appropriate. Will forward to PCP

## 2016-12-30 ENCOUNTER — Ambulatory Visit: Payer: Self-pay | Attending: Family Medicine | Admitting: Family Medicine

## 2016-12-30 ENCOUNTER — Other Ambulatory Visit: Payer: Self-pay | Admitting: Family Medicine

## 2016-12-30 ENCOUNTER — Encounter: Payer: Self-pay | Admitting: Family Medicine

## 2016-12-30 VITALS — BP 131/91 | HR 81 | Temp 98.0°F | Resp 18 | Ht 64.0 in | Wt 139.0 lb

## 2016-12-30 DIAGNOSIS — Z23 Encounter for immunization: Secondary | ICD-10-CM | POA: Insufficient documentation

## 2016-12-30 DIAGNOSIS — Z1239 Encounter for other screening for malignant neoplasm of breast: Secondary | ICD-10-CM

## 2016-12-30 DIAGNOSIS — Z7984 Long term (current) use of oral hypoglycemic drugs: Secondary | ICD-10-CM | POA: Insufficient documentation

## 2016-12-30 DIAGNOSIS — Z79899 Other long term (current) drug therapy: Secondary | ICD-10-CM | POA: Insufficient documentation

## 2016-12-30 DIAGNOSIS — Z8509 Personal history of malignant neoplasm of other digestive organs: Secondary | ICD-10-CM

## 2016-12-30 DIAGNOSIS — Z1231 Encounter for screening mammogram for malignant neoplasm of breast: Secondary | ICD-10-CM

## 2016-12-30 DIAGNOSIS — Z7982 Long term (current) use of aspirin: Secondary | ICD-10-CM | POA: Insufficient documentation

## 2016-12-30 DIAGNOSIS — Z760 Encounter for issue of repeat prescription: Secondary | ICD-10-CM

## 2016-12-30 DIAGNOSIS — R7303 Prediabetes: Secondary | ICD-10-CM | POA: Insufficient documentation

## 2016-12-30 DIAGNOSIS — I10 Essential (primary) hypertension: Secondary | ICD-10-CM | POA: Insufficient documentation

## 2016-12-30 DIAGNOSIS — Z09 Encounter for follow-up examination after completed treatment for conditions other than malignant neoplasm: Secondary | ICD-10-CM

## 2016-12-30 DIAGNOSIS — M7918 Myalgia, other site: Secondary | ICD-10-CM | POA: Insufficient documentation

## 2016-12-30 DIAGNOSIS — D509 Iron deficiency anemia, unspecified: Secondary | ICD-10-CM | POA: Insufficient documentation

## 2016-12-30 DIAGNOSIS — C49A0 Gastrointestinal stromal tumor, unspecified site: Secondary | ICD-10-CM | POA: Insufficient documentation

## 2016-12-30 LAB — POCT GLYCOSYLATED HEMOGLOBIN (HGB A1C): Hemoglobin A1C: 5.6

## 2016-12-30 LAB — GLUCOSE, POCT (MANUAL RESULT ENTRY): POC Glucose: 162 mg/dl — AB (ref 70–99)

## 2016-12-30 MED ORDER — AMLODIPINE BESYLATE 5 MG PO TABS: 5.0000 mg | ORAL_TABLET | Freq: Every day | ORAL | 0 refills | Status: AC

## 2016-12-30 MED ORDER — FERROUS SULFATE 325 (65 FE) MG PO TBEC: 325.0000 mg | DELAYED_RELEASE_TABLET | Freq: Three times a day (TID) | ORAL | 2 refills | Status: AC

## 2016-12-30 MED ORDER — METFORMIN HCL 500 MG PO TABS: 500.0000 mg | ORAL_TABLET | Freq: Two times a day (BID) | ORAL | 0 refills | Status: AC

## 2016-12-30 MED ORDER — LISINOPRIL-HYDROCHLOROTHIAZIDE 20-25 MG PO TABS: 1.0000 | ORAL_TABLET | Freq: Every day | ORAL | 0 refills | Status: AC

## 2016-12-30 MED ORDER — CLONIDINE HCL 0.2 MG PO TABS
0.2000 mg | ORAL_TABLET | Freq: Once | ORAL | Status: AC
  Administered 2016-12-30: 0.2 mg via ORAL

## 2016-12-30 MED ORDER — ACETAMINOPHEN 500 MG PO TABS: 1000.0000 mg | ORAL_TABLET | Freq: Four times a day (QID) | ORAL | 0 refills | Status: AC | PRN

## 2016-12-30 NOTE — Telephone Encounter (Signed)
CMA call regarding medication refill sent to [facility] pharmacy  Patient daughter was aware and  Understood

## 2016-12-30 NOTE — Patient Instructions (Addendum)
Please follow up with referral.   Heart-Healthy Eating Plan Heart-healthy meal planning includes:  Limiting unhealthy fats.  Increasing healthy fats.  Making other small dietary changes.  You may need to talk with your doctor or a diet specialist (dietitian) to create an eating plan that is right for you. What types of fat should I choose?  Choose healthy fats. These include olive oil and canola oil, flaxseeds, walnuts, almonds, and seeds.  Eat more omega-3 fats. These include salmon, mackerel, sardines, tuna, flaxseed oil, and ground flaxseeds. Try to eat fish at least twice each week.  Limit saturated fats. ? Saturated fats are often found in animal products, such as meats, butter, and cream. ? Plant sources of saturated fats include palm oil, palm kernel oil, and coconut oil.  Avoid foods with partially hydrogenated oils in them. These include stick margarine, some tub margarines, cookies, crackers, and other baked goods. These contain trans fats. What general guidelines do I need to follow?  Check food labels carefully. Identify foods with trans fats or high amounts of saturated fat.  Fill one half of your plate with vegetables and green salads. Eat 4-5 servings of vegetables per day. A serving of vegetables is: ? 1 cup of raw leafy vegetables. ?  cup of raw or cooked cut-up vegetables. ?  cup of vegetable juice.  Fill one fourth of your plate with whole grains. Look for the word "whole" as the first word in the ingredient list.  Fill one fourth of your plate with lean protein foods.  Eat 4-5 servings of fruit per day. A serving of fruit is: ? One medium whole fruit. ?  cup of dried fruit. ?  cup of fresh, frozen, or canned fruit. ?  cup of 100% fruit juice.  Eat more foods that contain soluble fiber. These include apples, broccoli, carrots, beans, peas, and barley. Try to get 20-30 g of fiber per day.  Eat more home-cooked food. Eat less restaurant, buffet, and  fast food.  Limit or avoid alcohol.  Limit foods high in starch and sugar.  Avoid fried foods.  Avoid frying your food. Try baking, boiling, grilling, or broiling it instead. You can also reduce fat by: ? Removing the skin from poultry. ? Removing all visible fats from meats. ? Skimming the fat off of stews, soups, and gravies before serving them. ? Steaming vegetables in water or broth.  Lose weight if you are overweight.  Eat 4-5 servings of nuts, legumes, and seeds per week: ? One serving of dried beans or legumes equals  cup after being cooked. ? One serving of nuts equals 1 ounces. ? One serving of seeds equals  ounce or one tablespoon.  You may need to keep track of how much salt or sodium you eat. This is especially true if you have high blood pressure. Talk with your doctor or dietitian to get more information. What foods can I eat? Grains Breads, including [place], white, pita, wheat, raisin, rye, oatmeal, and [place]. Tortillas that are neither fried nor made with lard or trans fat. Low-fat rolls, including hotdog and hamburger buns and English muffins. Biscuits. Muffins. Waffles. Pancakes. Light popcorn. Whole-grain cereals. Flatbread. Melba toast. Pretzels. Breadsticks. Rusks. Low-fat snacks. Low-fat crackers, including oyster, saltine, matzo, graham, animal, and rye. Rice and pasta, including brown rice and pastas that are made with whole wheat. Vegetables All vegetables. Fruits All fruits, but limit coconut. Meats and Other Protein Sources Lean, well-trimmed beef, veal, pork, and lamb. Chicken and  [place] without skin. All fish and shellfish. Wild duck, rabbit, pheasant, and venison. Egg whites or low-cholesterol egg substitutes. Dried beans, peas, lentils, and tofu. Seeds and most nuts. Dairy Low-fat or nonfat cheeses, including ricotta, string, and mozzarella. Skim or 1% milk that is liquid, powdered, or evaporated. Buttermilk that is made with low-fat milk. Nonfat or  low-fat yogurt. Beverages Mineral water. Diet carbonated beverages. Sweets and Desserts Sherbets and fruit ices. Honey, jam, marmalade, jelly, and syrups. Meringues and gelatins. Pure sugar candy, such as hard candy, jelly beans, gumdrops, mints, marshmallows, and small amounts of dark chocolate. [employer] Eat all sweets and desserts in moderation. Fats and Oils Nonhydrogenated (trans-free) margarines. Vegetable oils, including soybean, sesame, sunflower, olive, peanut, safflower, corn, canola, and cottonseed. Salad dressings or mayonnaise made with a vegetable oil. Limit added fats and oils that you use for cooking, baking, salads, and as spreads. Other Cocoa powder. Coffee and tea. All seasonings and condiments. The items listed above may not be a complete list of recommended foods or beverages. Contact your dietitian for more options. What foods are not recommended? Grains Breads that are made with saturated or trans fats, oils, or whole milk. Croissants. Butter rolls. Cheese breads. Sweet rolls. Donuts. Buttered popcorn. Chow mein noodles. High-fat crackers, such as cheese or butter crackers. Meats and Other Protein Sources Fatty meats, such as hotdogs, short ribs, sausage, spareribs, bacon, rib eye roast or steak, and mutton. High-fat deli meats, such as salami and bologna. Caviar. Domestic duck and goose. Organ meats, such as kidney, liver, sweetbreads, and heart. Dairy Cream, sour cream, cream cheese, and creamed cottage cheese. Whole-milk cheeses, including blue (bleu), Monte[place] Jack, [place], [place], American, [place], Swiss, cheddar, [place], and [place]. Whole or 2% milk that is liquid, evaporated, or condensed. Whole buttermilk. Cream sauce or high-fat cheese sauce. Yogurt that is made from whole milk. Beverages Regular sodas and juice drinks with added sugar. Sweets and Desserts Frosting. Pudding. Cookies. Cakes other than angel food cake. Candy that has milk chocolate or  white chocolate, hydrogenated fat, butter, coconut, or unknown ingredients. Buttered syrups. Full-fat ice cream or ice cream drinks. Fats and Oils Gravy that has suet, meat fat, or shortening. Cocoa butter, hydrogenated oils, palm oil, coconut oil, palm kernel oil. These can often be found in baked products, candy, fried foods, nondairy creamers, and whipped toppings. Solid fats and shortenings, including bacon fat, salt pork, lard, and butter. Nondairy cream substitutes, such as coffee creamers and sour cream substitutes. Salad dressings that are made of unknown oils, cheese, or sour cream. The items listed above may not be a complete list of foods and beverages to avoid. Contact your dietitian for more information. This information is not intended to replace advice given to you by your health care provider. Make sure you discuss any questions you have with your health care provider. Document Released: 08/13/2011 Document Revised: 07/20/2015 Document Reviewed: 08/05/2013 Elsevier Interactive Patient Education  [employer].

## 2016-12-30 NOTE — Telephone Encounter (Signed)
Refills sent to [facility] pharmacy. Recommend she keep follow up appointment today.

## 2016-12-31 MED FILL — FERROUS SULFATE 325 MG TAB: 325 (65 FE) | 30 days supply | Qty: 90 | Fill #0

## 2017-01-02 ENCOUNTER — Other Ambulatory Visit: Payer: Self-pay | Admitting: Family Medicine

## 2017-01-02 DIAGNOSIS — Z8509 Personal history of malignant neoplasm of other digestive organs: Secondary | ICD-10-CM

## 2017-01-05 NOTE — Progress Notes (Signed)
Subjective:  Patient ID: Katie Howell, female    DOB: January 26, 1951  Age: 66 y.o. MRN: [id number]  CC: Follow-up   HPI [name] presents for hypertension and pre-diabetes follow up. She is accompanied by a family member. PMH includes history of metastatic GIST, HTN, and pre-diabetes.  She is not exercising and is not adherent to low salt diet. SHe does not check BP at home. Cardiac symptoms none. Patient denies chest pain, chest pressure/discomfort, claudication, dyspnea, near-syncope, palpitations and syncope.  Cardiovascular risk factors: advanced age (older than 62 for men, 100 for women), hypertension, sedentary lifestyle and pre-diabetes. Use of agents associated with hypertension: none. History of target organ damage: none. She reports being without her BP medication for 1 week. She denies any  foot ulcerations, paresthesia of the feet, polydipsia, polyuria, visual disturbances and vomitting.  Evaluation to date has been included: fasting lipid panel, hemoglobin A1C and microalbuminuria.  Home sugars: patient does not check sugars. Treatment to date: more intensive attention to diet which and metformin which has been effective. She reports being without her metformin medication for 1 week. History of metastatic: History of metastatic GIST that was diagnosed in 2016.  History of Gleevec use. Denies following up since diagnosis. Denies any hematochezia, melena., or weight loss. She does reports generalized body aches, no fevers. Reports not following up for history of  GIST in almost 1 year. Referrals made to GI and Oncology. It was requested from specialist that patient's daughter follow up regarding paperwork but patient and  daughter did not follow up. Another referral was placed and patient was no show to oncology appointment. Previous workup : CT ABD   Outpatient Medications Prior to Visit  Medication Sig Dispense Refill  . amLODipine (NORVASC) 5 MG tablet Take 1 tablet (5 mg total) daily by  mouth. 90 tablet 0  . aspirin EC 81 MG tablet Take 1 tablet (81 mg total) by mouth daily. (Patient not taking: Reported on 07/04/2016) 90 tablet 3  . atorvastatin (LIPITOR) 20 MG tablet Take 1 tablet (20 mg total) by mouth daily. 90 tablet 1  . bacitracin 500 UNIT/GM ointment Apply 1 application topically 2 (two) times daily. To affected area, for 14 days. (Patient not taking: Reported on 07/04/2016) 28 g 0  . cephALEXin (KEFLEX) 500 MG capsule Take 1 capsule (500 mg total) by mouth 2 (two) times daily. 28 capsule 0  . ciprofloxacin (CIPRO) 250 MG tablet Take 1 tablet (250 mg total) by mouth 2 (two) times daily. 6 tablet 0  . glucose blood test strip Use as instructed 100 each 12  . levofloxacin (LEVAQUIN) 750 MG tablet Take 0.5 tablets (375 mg total) by mouth daily. 7 tablet 0  . lisinopril-hydrochlorothiazide (PRINZIDE,ZESTORETIC) 20-25 MG tablet Take 1 tablet daily by mouth. For blood pressure 90 tablet 0  . metFORMIN (GLUCOPHAGE) 500 MG tablet Take 1 tablet (500 mg total) 2 (two) times daily with a meal by mouth. 180 tablet 0  . potassium chloride (K-DUR) 10 MEQ tablet TAKE ONE TABLET BY MOUTH ONCE DAILY 30 tablet 0  . potassium chloride SA (K-DUR,KLOR-CON) 10 MEQ tablet Take 1 tablet (10 mEq total) by mouth daily. 30 tablet 0  . traMADol (ULTRAM) 50 MG tablet Take 1 tablet (50 mg total) by mouth every 8 (eight) hours as needed for severe pain. 40 tablet 0  . Vitamin D, Ergocalciferol, (DRISDOL) 50000 UNITS CAPS capsule Take 1 capsule (50,000 Units total) by mouth every 7 (seven) days. 30 capsule 7  .  acetaminophen (TYLENOL) 500 MG tablet Take 1 tablet (500 mg total) by mouth every 6 (six) hours as needed.  0  . ferrous sulfate 325 (65 FE) MG EC tablet Take 1 tablet (325 mg total) by mouth 3 (three) times daily with meals. 90 tablet 2   Facility-Administered Medications Prior to Visit  Medication Dose Route Frequency Provider Last Rate Last Dose  . cloNIDine (CATAPRES) tablet 0.2 mg  0.2 mg Oral  Once Raunel Dimartino, Maylon Peppers, FNP        ROS Review of Systems  Constitutional: Negative.   Respiratory: Negative.   Cardiovascular: Negative.   Gastrointestinal: Negative.   Musculoskeletal: Positive for myalgias.  Skin: Negative.    Objective:  BP (!) 131/91   Pulse 81   Temp 98 F (36.7 C) (Oral)   Resp 18   Ht 5\' 4"  (1.626 m)   Wt 139 lb (63 kg)   LMP 09/28/2012   SpO2 97%   BMI 23.86 kg/m   BP/Weight 12/30/2016 09/03/2016 08/28/5007  Systolic BP 381 829 937  Diastolic BP 91 93 169  Wt. (Lbs) 139 139.2 -  BMI 23.86 25.46 -    Physical Exam  Constitutional: She appears well-developed and well-nourished.  HENT:  Head: Normocephalic and atraumatic.  Right Ear: External ear normal.  Left Ear: External ear normal.  Nose: Nose normal.  Mouth/Throat: Oropharynx is clear and moist.  Eyes: Conjunctivae are normal. Pupils are equal, round, and reactive to light.  Neck: Normal range of motion. Neck supple. No JVD present.  Cardiovascular: Normal rate, regular rhythm, normal heart sounds and intact distal pulses.  Pulmonary/Chest: Effort normal and breath sounds normal.  Abdominal: Soft. Bowel sounds are normal. There is no tenderness.  Lymphadenopathy:    She has no cervical adenopathy.  Skin: Skin is warm and dry.  Psychiatric: She has a normal mood and affect.  Nursing note and vitals reviewed.  Assessment & Plan:   1. Follow up Patient has been referred multiple times to specialists in the past but she nor her family have followed up despite discussion on importance of following up as well as the risks of not doing so.  Encouraged to renew orange card.   2. Essential hypertension Schedule BP recheck in 2 weeks with nurse. - cloNIDine (CATAPRES) tablet 0.2 mg  3. History of malignant gastrointestinal stromal tumor (GIST) Patient has been referred multiple times to specialists in the past but she nor her family have followed up despite discussion on importance of  following up as well as the risks of not doing so.  - Ambulatory referral to Gastroenterology  4. Prediabetes  - Glucose (CBG) - HgB A1c  5. Iron deficiency anemia, unspecified iron deficiency anemia type  - ferrous sulfate 325 (65 FE) MG EC tablet; Take 1 tablet (325 mg total) 3 (three) times daily with meals by mouth.  Dispense: 90 tablet; Refill: 2  6. Musculoskeletal pain Medication refill. - acetaminophen (TYLENOL) 500 MG tablet; Take 2 tablets (1,000 mg total) every 6 (six) hours as needed by mouth for moderate pain.  Dispense: 30 tablet; Refill: 0  7. Needs flu shot  - Flu Vaccine QUAD 6+ mos PF IM (Fluarix Quad PF)  8. Screening breast examination  - MM SCREENING BREAST TOMO BILATERAL; Future     Follow-up: Return in about 2 weeks (around 01/13/2017) for BP check with Travia.Alfonse Spruce FNP

## 2017-01-07 ENCOUNTER — Telehealth: Payer: Self-pay

## 2017-01-07 NOTE — Telephone Encounter (Signed)
Attempted to call patient to discuss the referral placed by Fredia Beets FNP  And need for evaluation at [facility].  Telephone rang but no voicemail set up. I was unable to leave a message.

## 2017-01-10 ENCOUNTER — Ambulatory Visit: Payer: Self-pay | Attending: Family Medicine

## 2017-01-13 ENCOUNTER — Ambulatory Visit: Payer: Self-pay | Attending: Family Medicine | Admitting: *Deleted

## 2017-01-13 VITALS — BP 132/80 | HR 80

## 2017-01-13 DIAGNOSIS — I10 Essential (primary) hypertension: Secondary | ICD-10-CM | POA: Insufficient documentation

## 2017-01-15 NOTE — Progress Notes (Signed)
Pt arrived to [facility] with daughter. Pt alert and oriented, NAD.   Pt denies chest pain, SOB, HA, dizziness, or blurred vision.  Verified medication. Pt states medication was taken this morning.  Manual blood pressure reading: 132/80

## 2017-02-03 ENCOUNTER — Other Ambulatory Visit: Payer: Self-pay | Admitting: Family Medicine

## 2017-02-03 DIAGNOSIS — I10 Essential (primary) hypertension: Secondary | ICD-10-CM

## 2017-02-10 MED FILL — LISINOPRIL-HCTZ 20-25 MG TA: 20-25 | 30 days supply | Qty: 30 | Fill #0

## 2017-08-27 IMAGING — MR MR HEAD WO/W CM
10 of 12 series · 34 of 48 positions shown · IV contrast (multihance)
Comparison: None.

CLINICAL DATA: 66 y/o  F; daily headaches.  History of GIST tumor.

EXAM:
MRI HEAD WITHOUT AND WITH CONTRAST
TECHNIQUE: Multiplanar, multiecho pulse sequences of the brain and surrounding
structures were obtained without and with intravenous contrast.
CONTRAST:  15mL MULTIHANCE GADOBENATE DIMEGLUMINE 529 MG/ML IV SOLN

[Series 3: T1 · sagittal · 5.0mm · 0.47mm/px · 3 of 23 slices shown]
[im 1/23]
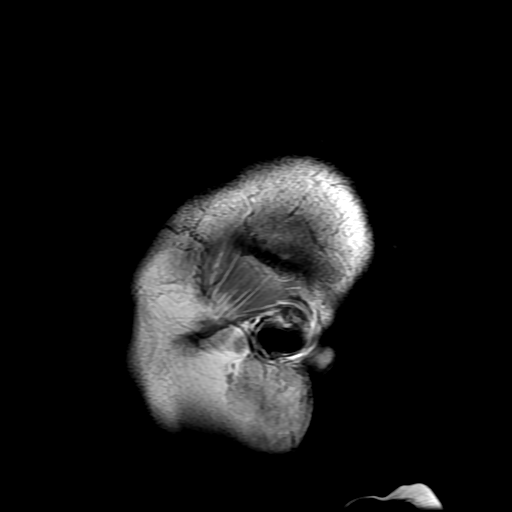
[im 12/23]
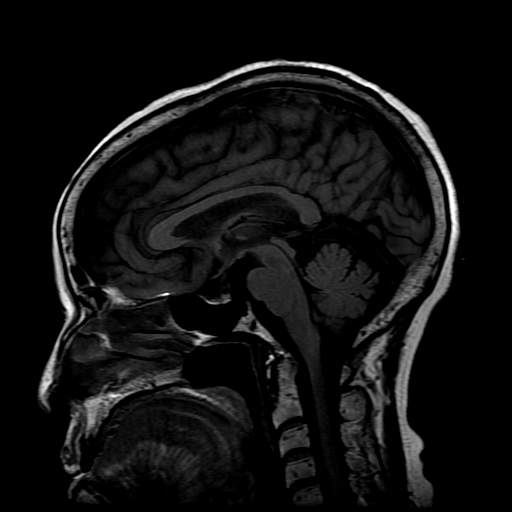
[im 23/23]
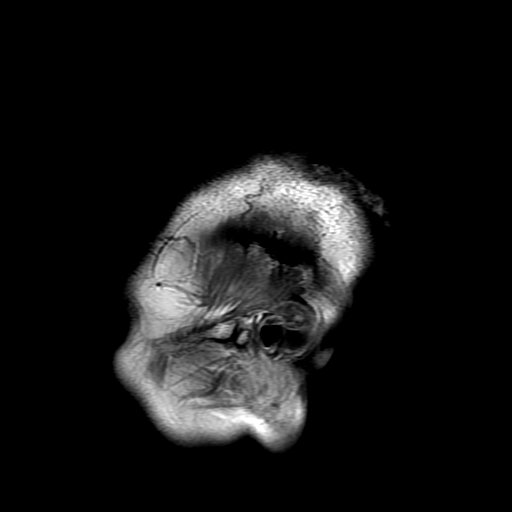

[Series 4: DWI · axial · 3.0mm · 1.09mm/px · z∈[-74,+51]mm · 9 of 90 slices shown (1 of 4)]
[im 1/90]
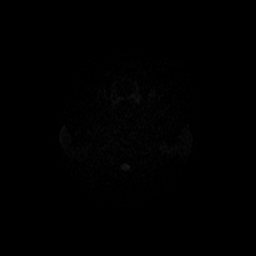
[im 12/90]
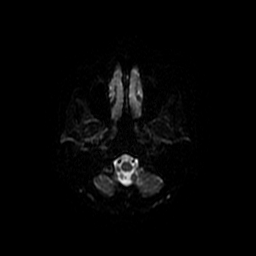
[im 23/90]
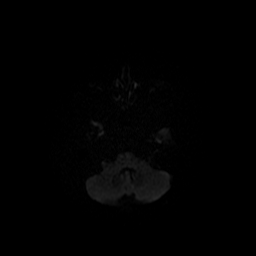
[im 34/90]
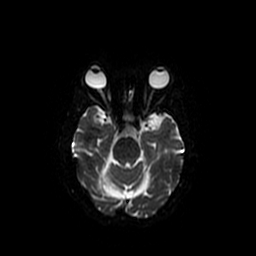
[im 45/90]
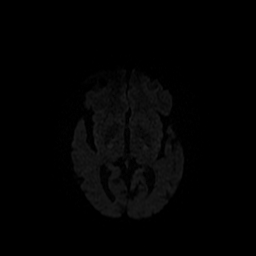
[im 56/90]
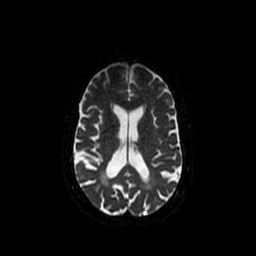
[im 67/90]
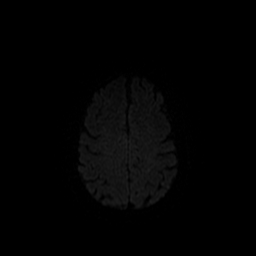
[im 78/90]
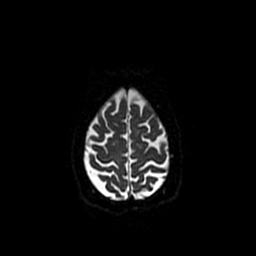
[im 90/90]
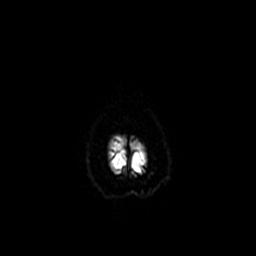

[Series 5: T2 · axial · 5.0mm · 0.43mm/px · z∈[-69,+57]mm · 2 of 23 slices shown]
[im 1/23]
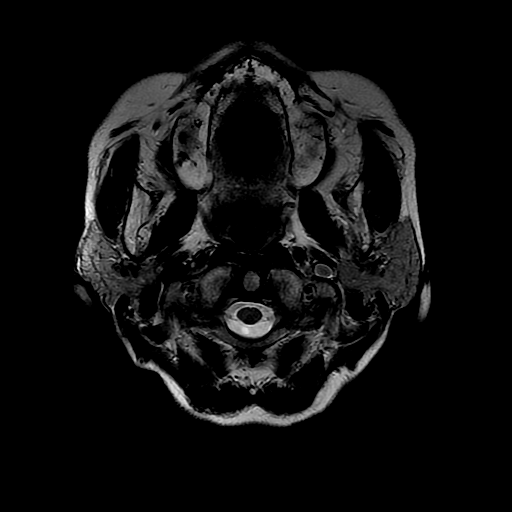
[im 23/23]
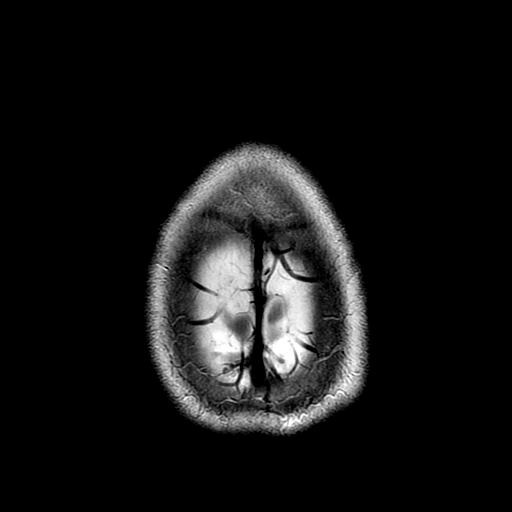

[Series 6: DWI · coronal · 5.0mm · 1.09mm/px · 6 of 64 slices shown (2 of 4)]
[im 1/64]
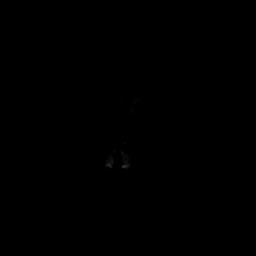
[im 13/64]
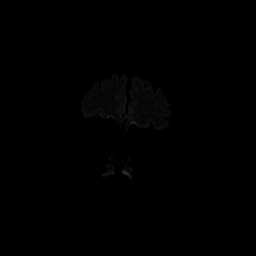
[im 26/64]
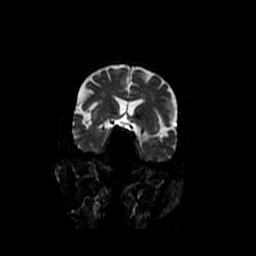
[im 38/64]
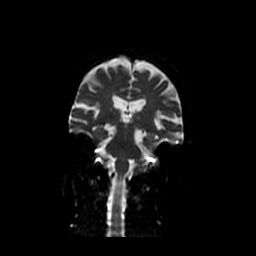
[im 51/64]
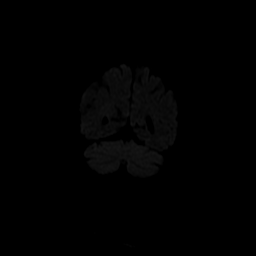
[im 64/64]
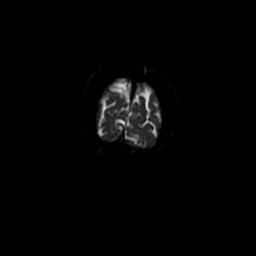

[Series 7: FLAIR · axial · 5.0mm · 0.43mm/px · z∈[-69,+57]mm · 2 of 23 slices shown]
[im 1/23]
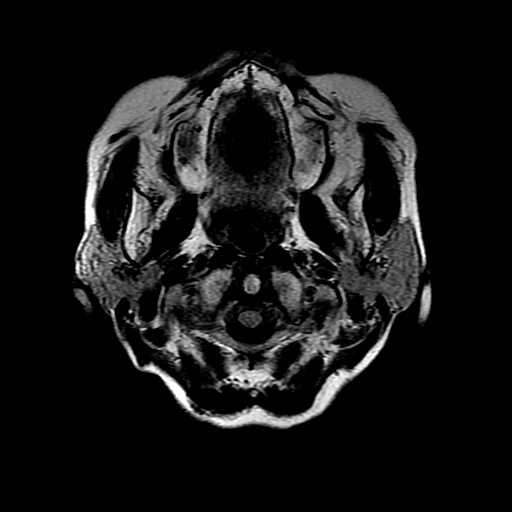
[im 23/23]
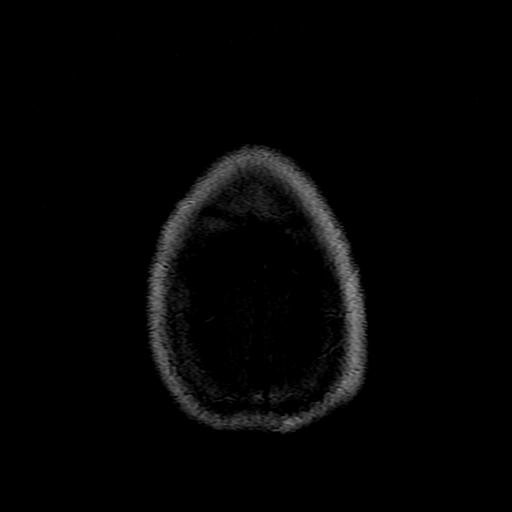

[Series 8: ax mpgr · axial · 5.0mm · 0.43mm/px · 1 of 23 slices shown]
[im 1/23]
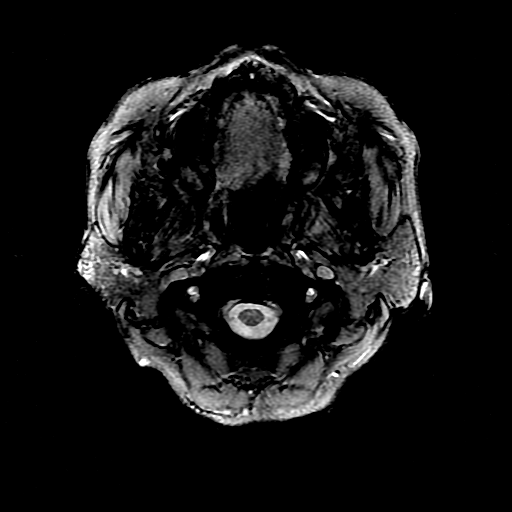

[Series 10: T2 post-contrast · coronal · 5.0mm · 0.39mm/px · 2 of 25 slices shown]
[im 1/25]
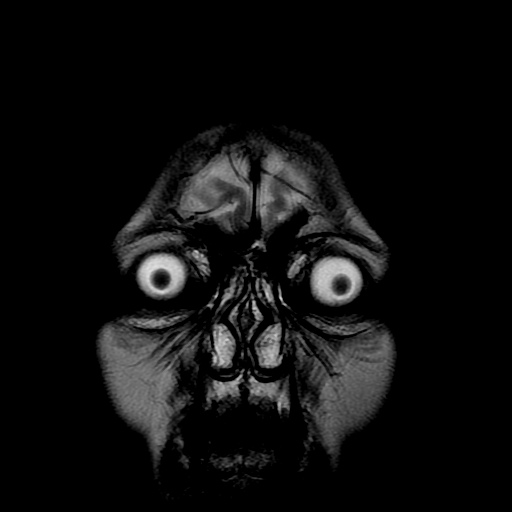
[im 25/25]
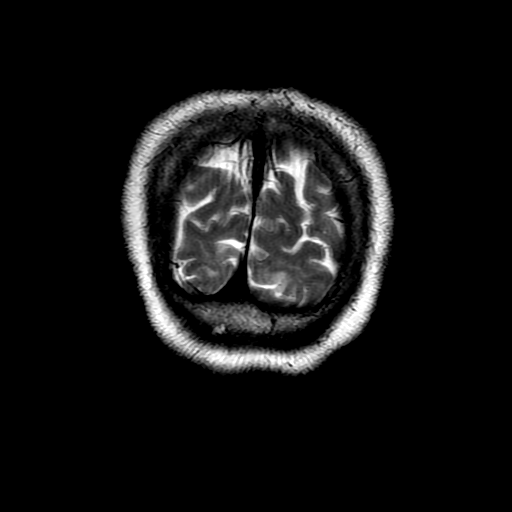

[Series 12: T1 post-contrast · coronal · 5.0mm · 0.39mm/px · 2 of 25 slices shown]
[im 1/25]
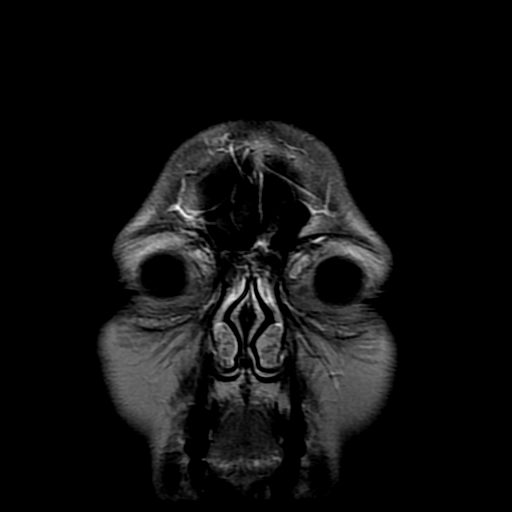
[im 25/25]
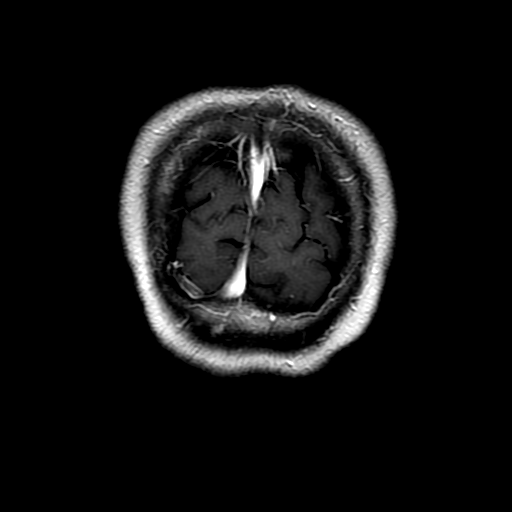

[Series 400: DWI · axial · 3.0mm · 1.09mm/px · z∈[-74,+51]mm · 4 of 45 slices shown (3 of 4)]
[im 1/45]
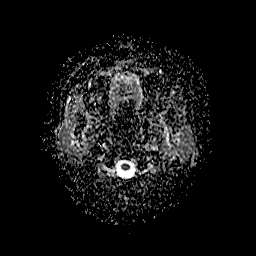
[im 15/45]
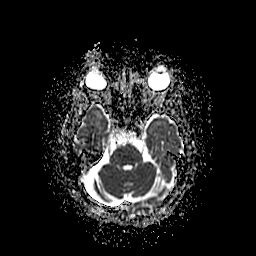
[im 30/45]
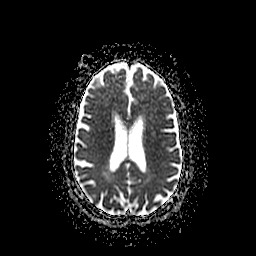
[im 45/45]
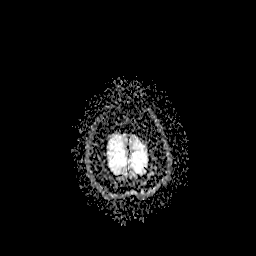

[Series 600: DWI · coronal · 5.0mm · 1.09mm/px · 3 of 32 slices shown (4 of 4)]
[im 1/32]
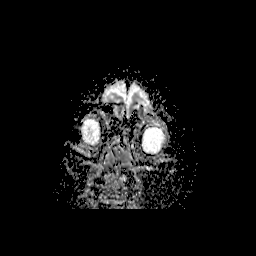
[im 16/32]
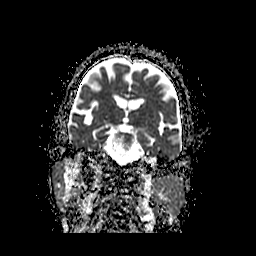
[im 32/32]
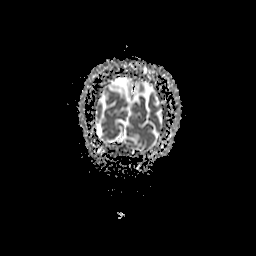

[34 of 48 positions shown; findings below may reference images not displayed]

FINDINGS: Brain: No acute infarction, hemorrhage, hydrocephalus, extra-axial
collection or mass lesion. Nonspecific foci of T2 FLAIR hyperintense
signal abnormality in subcortical and periventricular white matter
is compatible with mild chronic microvascular ischemic changes.
There is mild brain parenchymal volume loss. After administration of
intravenous contrast no abnormal enhancement of the brain is
identified.

Vascular: Normal flow voids.

Skull and upper cervical spine: Normal marrow signal.

Sinuses/Orbits: Negative.

Other: None.
IMPRESSION: 1. No acute intracranial abnormality.
2. Mild chronic microvascular ischemic changes and mild parenchymal
volume loss of the brain.
3. No intracranial metastatic disease identified.

By: Joseph Vincent Florendo M.D.

## 2017-08-27 IMAGING — CT CT ABDOMEN W/ CM
2 of 6 series · 15 of 46 positions shown, 17 images · IV contrast (iopamidol)
Comparison: No priors.

CLINICAL DATA: 66-year-old female with history of left-sided
abdominal pain. History of metastatic GI stromal tumor (GIST).

EXAM:
CT ABDOMEN WITH CONTRAST
TECHNIQUE: Multidetector CT imaging of the abdomen was performed using the
standard protocol following bolus administration of intravenous
contrast.
CONTRAST:  100mL 5XDKC7-1LL IOPAMIDOL (5XDKC7-1LL) INJECTION 61%

[Series 6: abdroutine 1.0 i31s 1 · axial · 0.65mm/px · z∈[-173,+58]mm · 12 of 361 slices shown, 14 images]
[im 16/361  soft-tissue]
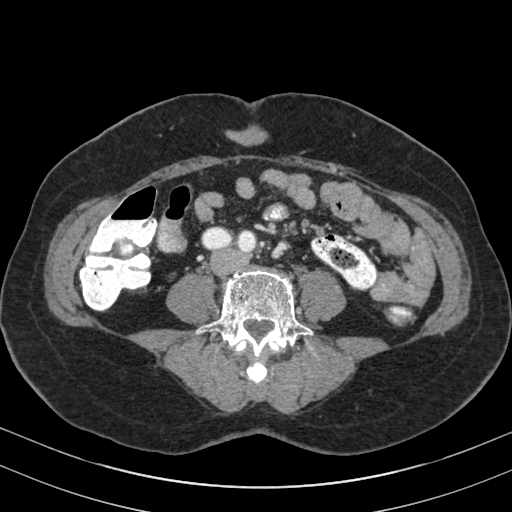
[im 16/361  bone]
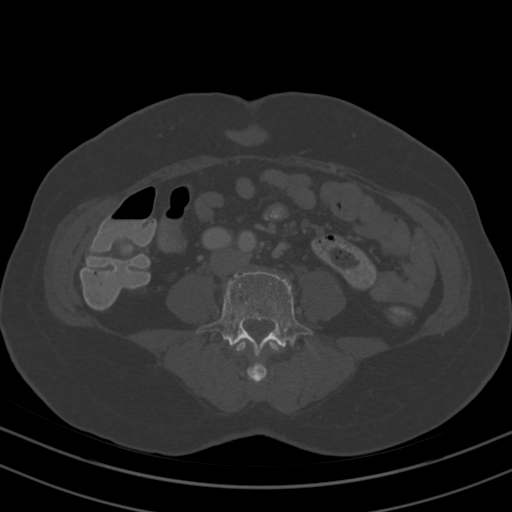
[im 46/361  soft-tissue]
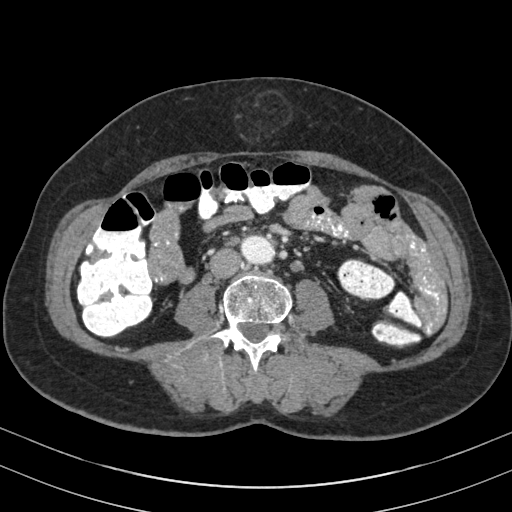
[im 76/361  soft-tissue]
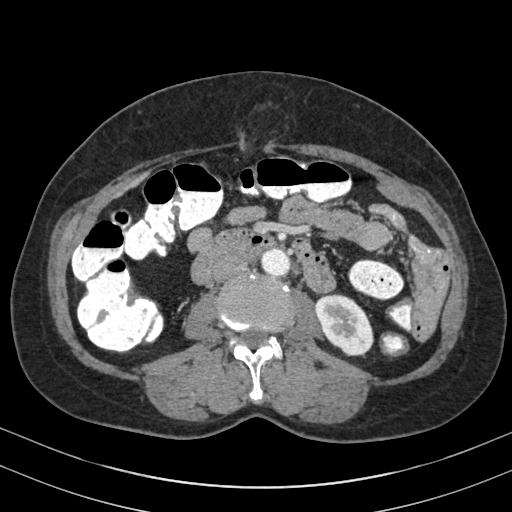
[im 106/361  soft-tissue]
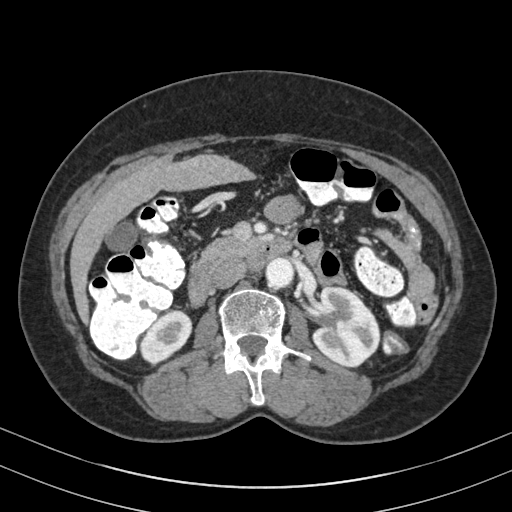
[im 136/361  soft-tissue]
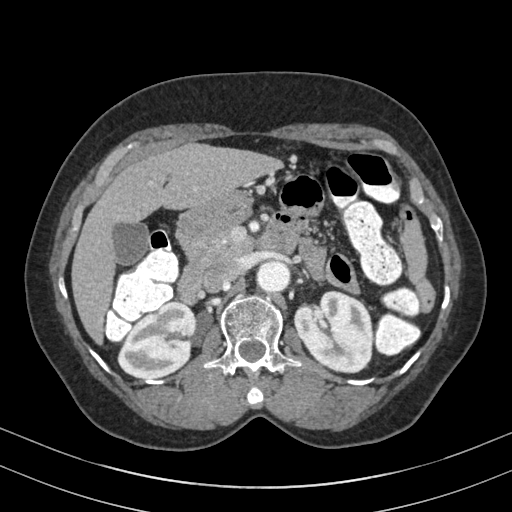
[im 166/361  soft-tissue]
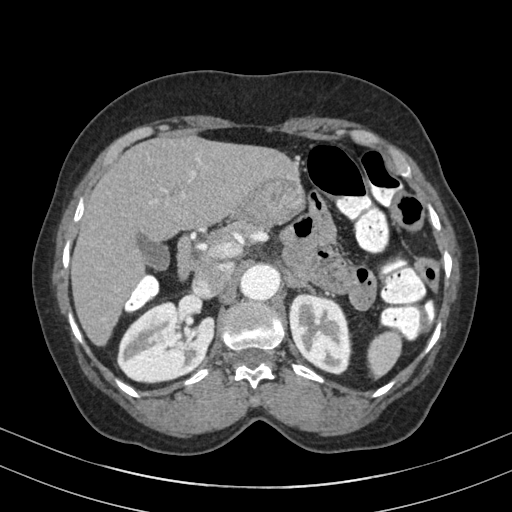
[im 196/361  soft-tissue]
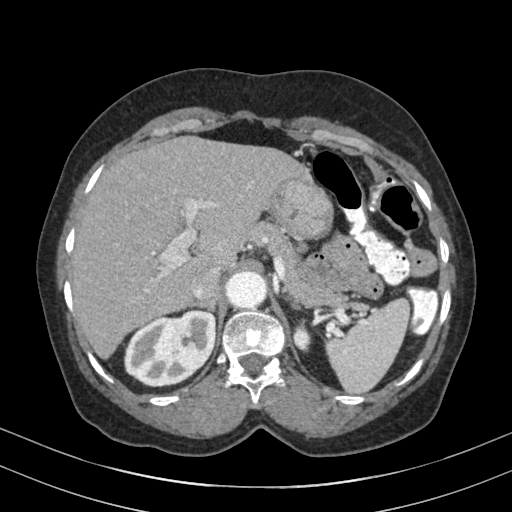
[im 226/361  soft-tissue]
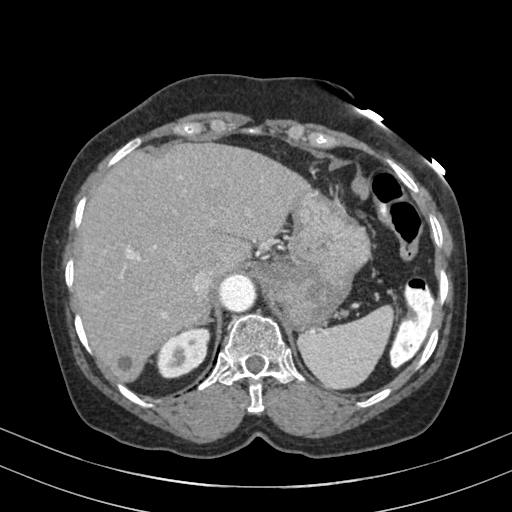
[im 256/361  soft-tissue]
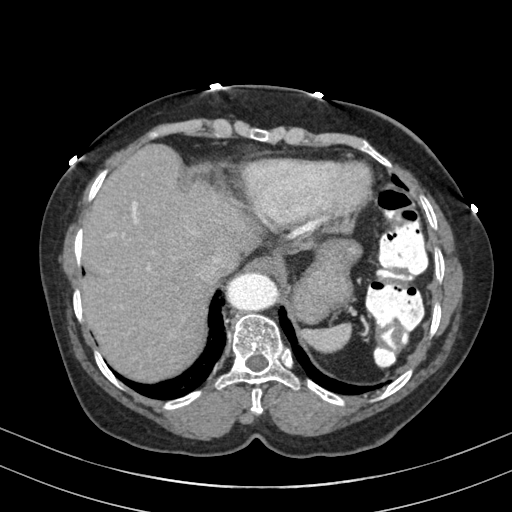
[im 256/361  bone]
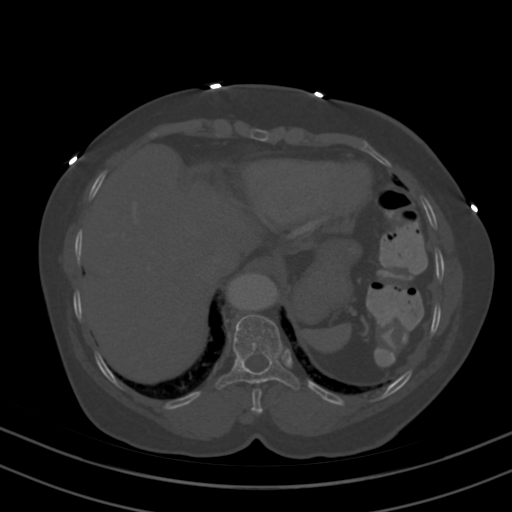
[im 286/361  soft-tissue]
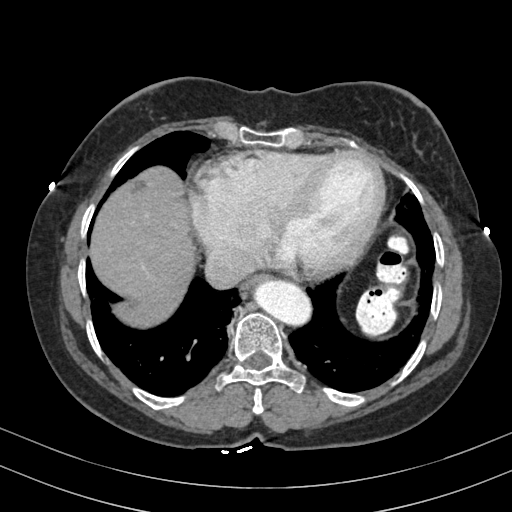
[im 316/361  soft-tissue]
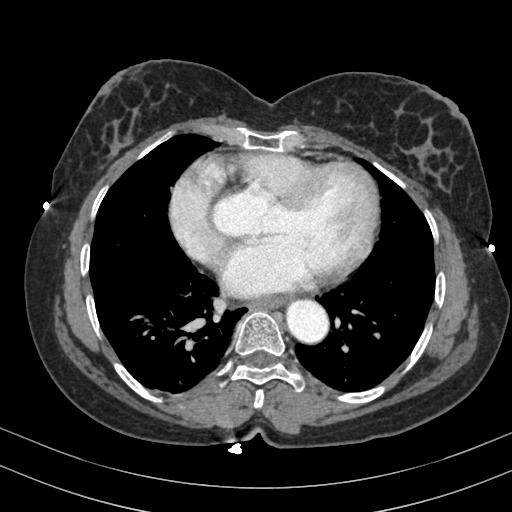
[im 346/361  soft-tissue]
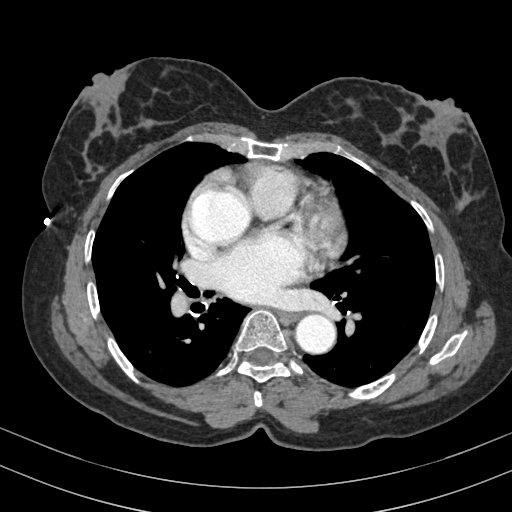

[Series 8: abdroutine 2.0 mpr cor · coronal · 0.51mm/px · 3 of 112 slices shown]
[im 38/112  soft-tissue]
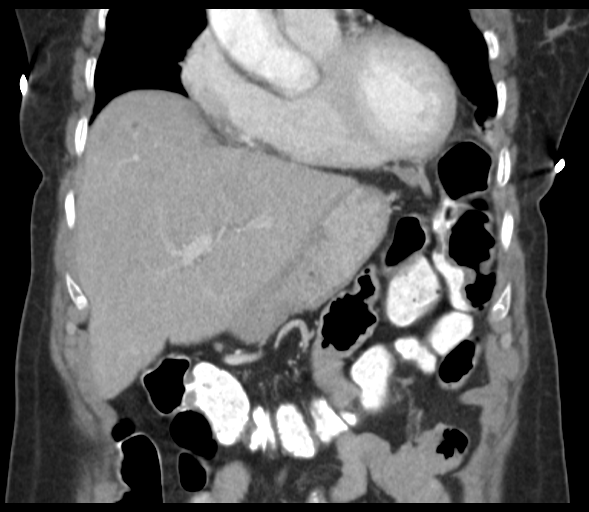
[im 50/112  soft-tissue]
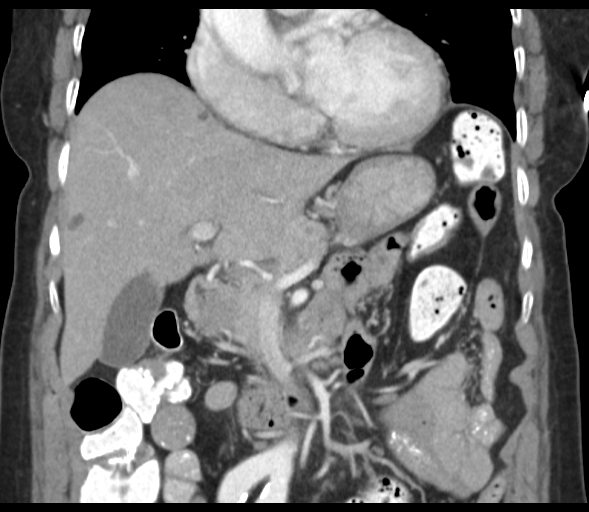
[im 62/112  soft-tissue]
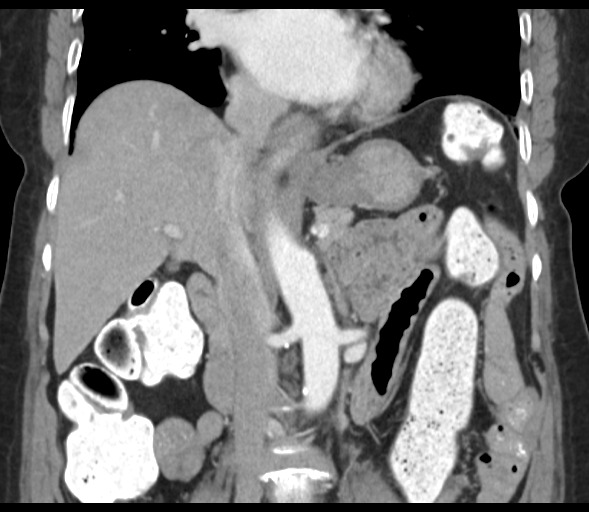

[15 of 46 positions shown; findings below may reference images not displayed]

FINDINGS: Lower chest: Small cyst in the periphery of the right lower lobe.
Cardiomegaly.

Hepatobiliary: Multiple ill-defined hypovascular lesions in the
liver, largest of which measures up to 15 x 9 mm in the periphery of
segment 4A (axial image 6 of series 3), indeterminate but concerning
for metastatic lesions. Calcified granuloma in the right lobe of the
liver. No intra or extrahepatic biliary ductal dilatation.
Gallbladder is normal in appearance.

Pancreas: No pancreatic mass. No pancreatic ductal dilatation. No
pancreatic or peripancreatic fluid or inflammatory changes.

Spleen: Unremarkable.

Adrenals/Urinary Tract: Bilateral kidneys and bilateral adrenal
glands are normal in appearance. No hydroureteronephrosis in the
visualized portions of the abdomen.

Stomach/Bowel: In the proximal stomach involving portions of the
cardia and proximal fundus along the inferior aspect of the greater
curvature there is an area of subtle mass-like thickening which is
poorly evaluated secondary to under distention of the stomach, but
estimated to measure approximately 2.3 x 4.5 x 2.3 cm (axial image
15 of series 3 and coronal image 71 of series 8), concerning for the
reported primary GIST. Remainder of the stomach is otherwise
unremarkable in appearance. No pathologic dilatation of the
visualized portions of small bowel or colon.

Vascular/Lymphatic: No aneurysm identified in the visualized
abdominal vasculature. Aortic atherosclerosis. No lymphadenopathy
noted in the abdomen.

Other: Moderate size supraumbilical ventral hernia containing
omental fat. No significant volume of ascites and no
pneumoperitoneum noted in the visualized portions of the peritoneal
cavity.

Musculoskeletal: There are no aggressive appearing lytic or blastic
lesions noted in the visualized portions of the skeleton.
IMPRESSION: 1. Probable proximal gastric mass estimated to measure and 2.3 x
x 2.3 cm, as detailed above, concerning for reported GIST. Multiple
hypovascular liver lesions are indeterminate on today's CT
examination, but concerning for potential metastatic lesions. These
could be better evaluated with followup MRI of the abdomen with and
without IV gadolinium if clinically appropriate.
2. Moderate-sized supraumbilical ventral hernia containing only
omental fat.
3. Aortic atherosclerosis.
4. Additional incidental findings, as above.
